# Patient Record
Sex: Female | Born: 1990
Health system: Southern US, Community
[De-identification: ages and names within clinical notes are randomized; demographics above are authoritative.]

## PROBLEM LIST (undated history)

## (undated) DIAGNOSIS — F419 Anxiety disorder, unspecified: Secondary | ICD-10-CM

## (undated) DIAGNOSIS — T4145XA Adverse effect of unspecified anesthetic, initial encounter: Secondary | ICD-10-CM

## (undated) DIAGNOSIS — T8859XA Other complications of anesthesia, initial encounter: Secondary | ICD-10-CM

## (undated) DIAGNOSIS — R51 Headache: Secondary | ICD-10-CM

## (undated) DIAGNOSIS — R519 Headache, unspecified: Secondary | ICD-10-CM

## (undated) DIAGNOSIS — J45909 Unspecified asthma, uncomplicated: Secondary | ICD-10-CM

## (undated) HISTORY — PX: LAPAROSCOPIC GASTRIC SLEEVE RESECTION: SHX5895

## (undated) HISTORY — PX: TONSILLECTOMY: SUR1361

---

## 2015-05-12 DIAGNOSIS — K219 Gastro-esophageal reflux disease without esophagitis: Secondary | ICD-10-CM | POA: Insufficient documentation

## 2015-09-15 DIAGNOSIS — R7309 Other abnormal glucose: Secondary | ICD-10-CM | POA: Insufficient documentation

## 2015-11-09 DIAGNOSIS — G43919 Migraine, unspecified, intractable, without status migrainosus: Secondary | ICD-10-CM | POA: Diagnosis not present

## 2015-11-09 MED FILL — SUMATRIPTAN SUCC 50 MG TAB: 50 | 30 days supply | Qty: 9 | Fill #0

## 2015-12-15 MED FILL — TOPIRAMATE 25 MG TABLET: 25 | 30 days supply | Qty: 30 | Fill #0

## 2016-01-11 MED FILL — VALACYCLOVIR HCL 500 MG TAB: 500 | 15 days supply | Qty: 30 | Fill #0

## 2016-01-21 DIAGNOSIS — T300 Burn of unspecified body region, unspecified degree: Secondary | ICD-10-CM | POA: Diagnosis not present

## 2016-01-21 MED FILL — SILVADENE 1% CREAM: 1 | 30 days supply | Qty: 50 | Fill #0

## 2016-02-08 DIAGNOSIS — J209 Acute bronchitis, unspecified: Secondary | ICD-10-CM | POA: Diagnosis not present

## 2016-02-08 DIAGNOSIS — Z30431 Encounter for routine checking of intrauterine contraceptive device: Secondary | ICD-10-CM | POA: Diagnosis not present

## 2016-02-08 DIAGNOSIS — H109 Unspecified conjunctivitis: Secondary | ICD-10-CM | POA: Diagnosis not present

## 2016-02-08 DIAGNOSIS — Z975 Presence of (intrauterine) contraceptive device: Secondary | ICD-10-CM | POA: Diagnosis not present

## 2016-02-08 MED FILL — AZITHROMYCIN 250 MG TABLET: 250 | 5 days supply | Qty: 6 | Fill #0

## 2016-02-08 MED FILL — POLYMYXIN B/TMP EYE DROPS: 10000-0.1 | 25 days supply | Qty: 10 | Fill #0

## 2016-03-29 DIAGNOSIS — B001 Herpesviral vesicular dermatitis: Secondary | ICD-10-CM | POA: Diagnosis not present

## 2016-03-29 DIAGNOSIS — J209 Acute bronchitis, unspecified: Secondary | ICD-10-CM | POA: Diagnosis not present

## 2016-03-29 MED FILL — ALBUTEROL 0.083% INHAL SOLN: (2.5 MG/3ML | 13 days supply | Qty: 150 | Fill #0

## 2016-03-29 MED FILL — VENTOLIN HFA 90 MCG INHALER: 108 (90 BAS | 30 days supply | Qty: 18 | Fill #0

## 2016-03-29 MED FILL — VALACYCLOVIR HCL 500 MG TAB: 500 | 30 days supply | Qty: 30 | Fill #0

## 2016-03-29 MED FILL — predniSONE 10 MG TABS: 10 | 6 days supply | Qty: 21 | Fill #0

## 2016-03-31 MED FILL — PROMETHAZINE-DM SYRUP: 6.25-15 | 6 days supply | Qty: 118 | Fill #0

## 2016-03-31 MED FILL — AZITHROMYCIN 250 MG TABLET: 250 | 5 days supply | Qty: 6 | Fill #0

## 2016-04-01 ENCOUNTER — Encounter (HOSPITAL_BASED_OUTPATIENT_CLINIC_OR_DEPARTMENT_OTHER): Payer: Self-pay | Admitting: Emergency Medicine

## 2016-04-01 ENCOUNTER — Emergency Department (HOSPITAL_BASED_OUTPATIENT_CLINIC_OR_DEPARTMENT_OTHER)
Admission: EM | Admit: 2016-04-01 | Discharge: 2016-04-01 | Disposition: A | Discharge status: Home / Self Care | Payer: 59 | Attending: Emergency Medicine | Admitting: Emergency Medicine

## 2016-04-01 ENCOUNTER — Emergency Department (HOSPITAL_BASED_OUTPATIENT_CLINIC_OR_DEPARTMENT_OTHER): Payer: 59

## 2016-04-01 DIAGNOSIS — R05 Cough: Secondary | ICD-10-CM | POA: Diagnosis not present

## 2016-04-01 DIAGNOSIS — J45909 Unspecified asthma, uncomplicated: Secondary | ICD-10-CM | POA: Insufficient documentation

## 2016-04-01 DIAGNOSIS — J069 Acute upper respiratory infection, unspecified: Secondary | ICD-10-CM | POA: Diagnosis not present

## 2016-04-01 HISTORY — DX: Unspecified asthma, uncomplicated: J45.909

## 2016-04-01 NOTE — Discharge Instructions (Signed)
Continue with your current treatment plan. Follow-up with your primary care provider.

## 2016-04-01 NOTE — ED Notes (Signed)
ED Provider at bedside. 

## 2016-04-01 NOTE — ED Triage Notes (Signed)
Pt states that since Wednesday she has had cough and chest congestion and isn't getting better

## 2016-04-01 NOTE — ED Notes (Signed)
Pt and mother given d/c instructions as per chart. Verbalizes understanding. No questions. 

## 2016-04-01 NOTE — ED Notes (Signed)
Pt states she was diagnosed with bronchitis on Wed and her asthma is flaring up. Given Rx for antibiotic yesterday and had 2nd dose today. Also states her treatments are not helping. Presents in no distress. BBS-few scattered rhonchi noted.

## 2016-04-01 NOTE — ED Provider Notes (Signed)
[facility] Provider Note   CSN: [id number] Arrival date & time: 04/01/16  2115  By signing my name below, I, Tracy Brooks, attest that this documentation has been prepared under the direction and in the presence of [name], NP-C. Electronically Signed: Orpah Brooks , ED Scribe. 04/01/16. 11:04 PM.   History   Chief Complaint Chief Complaint  Patient presents with  . Cough    HPI  Tracy Brooks is a 26 y.o. female who presents to the Emergency Department complaining of mild to moderate cough with sudden onset x3 days ago. Pt states that she was seen by her PCP x3 days ago and diagnosed with bronchitis. Pt states that she was prescribed Promethazine, Prednisone and has taken nebulizer treatments with mild relief. She reports cough, chest tightness, chills. Pt denies fever.     The history is provided by the patient. No language interpreter was used.    Past Medical History:  Diagnosis Date  . Asthma     There are no active problems to display for this patient.   Past Surgical History:  Procedure Laterality Date  . TONSILLECTOMY      OB History    No data available       Home Medications    Prior to Admission medications   Medication Sig Start Date End Date Taking? Authorizing Provider  ALBUTEROL SULFATE HFA IN Inhale into the lungs.   Yes Historical Provider, MD    Family History History reviewed. No pertinent family history.  Social History Social History  Substance Use Topics  . Smoking status: Never Smoker  . Smokeless tobacco: Never Used  . Alcohol use Yes     Allergies   Patient has no known allergies.   Review of Systems Review of Systems  Constitutional: Positive for chills. Negative for fever.  Respiratory: Positive for cough and chest tightness.   All other systems reviewed and are negative.    Physical Exam Updated Vital Signs BP 157/96   Pulse 103   Temp 98.6 F (37 C) (Oral)   Resp 18   Ht 5\' 3"  (1.6 m)    Wt 286 lb (129.7 kg)   SpO2 100%   BMI 50.66 kg/m   Physical Exam  Constitutional: She appears well-developed and well-nourished. No distress.  HENT:  Head: Normocephalic and atraumatic.  Eyes: Conjunctivae are normal.  Neck: Neck supple.  Cardiovascular: Normal rate and regular rhythm.   No murmur heard. Pulmonary/Chest: Effort normal and breath sounds normal. No respiratory distress.  Chest is clear.  Abdominal: Soft. There is no tenderness.  Musculoskeletal: She exhibits no edema.  Neurological: She is alert.  Skin: Skin is warm and dry.  Psychiatric: She has a normal mood and affect.  Nursing note and vitals reviewed.    ED Treatments / Results   DIAGNOSTIC STUDIES: Oxygen Saturation is 100% on RA, normal by my interpretation.   COORDINATION OF CARE: 11:05 PM-Discussed next steps with pt. Pt verbalized understanding and is agreeable with the plan.    Labs (all labs ordered are listed, but only abnormal results are displayed) Labs Reviewed - No data to display  EKG  EKG Interpretation None       Radiology Dg Chest 2 View  Result Date: 04/01/2016 CLINICAL DATA:  Cough and chest congestion since Wednesday, not improving. EXAM: CHEST  2 VIEW COMPARISON:  None. FINDINGS: The heart size and mediastinal contours are within normal limits. Both lungs are clear. The visualized skeletal structures are unremarkable. IMPRESSION:  No active cardiopulmonary disease. Electronically Signed   By: Burman NievesWilliam  Stevens M.D.   On: 04/01/2016 21:38    Procedures Procedures (including critical care time)  Medications Ordered in ED Medications - No data to display   Initial Impression / Assessment and Plan / ED Course  I have reviewed the triage vital signs and the nursing notes.  Pertinent labs & imaging results that were available during my care of the patient were reviewed by me and considered in my medical decision making (see chart for details).  Clinical Course   Pt  symptoms consistent with URI.CXR negative for acute infiltrate. Pt will be discharged with symptomatic treatment. Patient has already been evaluated by her PCP and is currently on azithromycin, prednisone, phenergan DM, albuterol MDI. Discussed return precautions.  Pt is hemodynamically stable & in NAD prior to discharge.    Final Clinical Impressions(s) / ED Diagnoses   Final diagnoses:  Upper respiratory tract infection, unspecified type    New Prescriptions Discharge Medication List as of 04/01/2016 11:36 PM    I personally performed the services described in this documentation, which was scribed in my presence. The recorded information has been reviewed and is accurate.    Felicie Mornavid Yoshio Seliga, NP 04/02/16 0022    Blane OharaJoshua Zavitz, MD 04/02/16 [phone]

## 2016-04-24 MED FILL — OSELTAMIVIR PHOS 75 MG CAP: 75 | 10 days supply | Qty: 10 | Fill #0

## 2016-05-10 DIAGNOSIS — Z30431 Encounter for routine checking of intrauterine contraceptive device: Secondary | ICD-10-CM | POA: Diagnosis not present

## 2016-05-11 DIAGNOSIS — E6609 Other obesity due to excess calories: Secondary | ICD-10-CM | POA: Diagnosis not present

## 2016-05-11 DIAGNOSIS — Z6841 Body Mass Index (BMI) 40.0 and over, adult: Secondary | ICD-10-CM | POA: Diagnosis not present

## 2016-05-11 DIAGNOSIS — F5104 Psychophysiologic insomnia: Secondary | ICD-10-CM | POA: Diagnosis not present

## 2016-06-08 ENCOUNTER — Encounter (INDEPENDENT_AMBULATORY_CARE_PROVIDER_SITE_OTHER): Payer: Self-pay | Admitting: Family Medicine

## 2016-07-27 ENCOUNTER — Other Ambulatory Visit (HOSPITAL_COMMUNITY): Payer: Self-pay | Admitting: Surgery

## 2016-08-15 ENCOUNTER — Encounter: Payer: 59 | Attending: Surgery | Admitting: Registered"

## 2016-08-15 ENCOUNTER — Ambulatory Visit: Payer: 59 | Admitting: Registered"

## 2016-08-15 ENCOUNTER — Encounter: Payer: Self-pay | Admitting: Registered"

## 2016-08-15 DIAGNOSIS — Z713 Dietary counseling and surveillance: Secondary | ICD-10-CM | POA: Diagnosis not present

## 2016-08-15 DIAGNOSIS — Z6841 Body Mass Index (BMI) 40.0 and over, adult: Secondary | ICD-10-CM | POA: Diagnosis not present

## 2016-08-15 DIAGNOSIS — E669 Obesity, unspecified: Secondary | ICD-10-CM

## 2016-08-15 NOTE — Progress Notes (Signed)
Pre-Op Assessment Visit:  Pre-Operative Sleeve Gastrectomy Surgery  Medical Nutrition Therapy:  Appt start time: 9:45  End time:  11:00  Patient was seen on 08/15/2016 for Pre-Operative Nutrition Assessment. Assessment and [facility] on 08/15/2016.   Pt expectation of surgery: lose weight and be healthier  Pt expectation of Dietitian: help understanding what needs to be done, accountability, teach how to eat correctly  Start weight at [facility]: 293.3 BMI: 52.37   Pt arrives with mom. Pt states she is [place] and eats a lot of starches. Pt states she doesn't eat a lot of sweets but has her days where she craves them. Pt states she enjoys chips. Pt states her ideal goal size would be size 10.   Per insurance, pt needs 0 SWL visits prior to surgery.    24 hr Dietary Recall: First Meal: eggs or pancakes or McDonalds biscuit Snack: chips with mojito dip (garlic, oil, vinegar, salt, pepper, and spanish seasoning) Second Meal: skips normally; frozen meals, steamers Snack: none Third Meal: meat or pasta and 2-3 vegetables  Snack: sugar cookies Beverages: water, soda (occasionally)  Encouraged to engage in 150 minutes of moderate physical activity including cardiovascular and weight baring weekly  Handouts given during visit include:  . Pre-Op Goals . Bariatric Surgery Protein Shakes . Vitamin and Mineral Recommendations  During the appointment today the following Pre-Op Goals were reviewed with the patient: . Maintain or lose weight as instructed by your surgeon . Make healthy food choices (no fast food) . Begin to limit portion sizes . Limited concentrated sugars and fried foods . Keep fat/sugar in the single digits per serving on food labels . Practice CHEWING your food  (aim for 30 chews per bite or until applesauce consistency) . Practice not drinking 15 minutes before, during, and 30 minutes after  each meal/snack . Avoid all carbonated beverages  . Avoid/limit caffeinated beverages  . Avoid all sugar-sweetened beverages . Consume 3 meals per day; eat every 3-5 hours . Make a list of non-food related activities . Aim for 64-100 ounces of FLUID daily  . Aim for at least 60-80 grams of PROTEIN daily . Look for a liquid protein source that contain ?15 g protein and ?5 g carbohydrate  (ex: shakes, drinks, shots) . Physical activity is an important part of a healthy lifestyle so keep it moving!  Follow diet recommendations listed below Energy and Macronutrient Recommendations: Calories: 1800 Carbohydrate: 200 Protein: 135 Fat: 50  Demonstrated degree of understanding via:  Teach Back   Teaching Method Utilized:  Visual Auditory Hands on  Barriers to learning/adherence to lifestyle change: none  Patient to call the [facility] to enroll in Pre-Op and Post-Op Nutrition Education when surgery date is scheduled.

## 2016-08-22 ENCOUNTER — Ambulatory Visit: Payer: 59 | Admitting: Registered"

## 2016-08-22 ENCOUNTER — Ambulatory Visit (HOSPITAL_COMMUNITY): Payer: 59

## 2016-08-29 ENCOUNTER — Ambulatory Visit (HOSPITAL_COMMUNITY)
Admission: RE | Admit: 2016-08-29 | Discharge: 2016-08-29 | Disposition: A | Discharge status: Home / Self Care | Payer: 59 | Source: Ambulatory Visit | Attending: Surgery | Admitting: Surgery

## 2016-08-29 ENCOUNTER — Encounter (HOSPITAL_COMMUNITY): Payer: Self-pay | Admitting: Radiology

## 2016-08-29 ENCOUNTER — Other Ambulatory Visit: Payer: Self-pay

## 2016-08-29 ENCOUNTER — Other Ambulatory Visit (HOSPITAL_COMMUNITY): Payer: Self-pay | Admitting: Surgery

## 2016-08-29 DIAGNOSIS — Z01810 Encounter for preprocedural cardiovascular examination: Secondary | ICD-10-CM | POA: Insufficient documentation

## 2016-08-29 DIAGNOSIS — Z01818 Encounter for other preprocedural examination: Secondary | ICD-10-CM | POA: Diagnosis not present

## 2016-09-21 ENCOUNTER — Ambulatory Visit (INDEPENDENT_AMBULATORY_CARE_PROVIDER_SITE_OTHER): Payer: 59 | Admitting: Psychiatry

## 2016-09-21 DIAGNOSIS — F509 Eating disorder, unspecified: Secondary | ICD-10-CM | POA: Diagnosis not present

## 2016-09-26 ENCOUNTER — Ambulatory Visit (INDEPENDENT_AMBULATORY_CARE_PROVIDER_SITE_OTHER): Payer: 59 | Admitting: Psychiatry

## 2016-09-26 DIAGNOSIS — F509 Eating disorder, unspecified: Secondary | ICD-10-CM

## 2016-10-05 DIAGNOSIS — M25512 Pain in left shoulder: Secondary | ICD-10-CM | POA: Diagnosis not present

## 2016-10-05 MED FILL — tiZANidine HCL 4 MG TABS: 4 | 7 days supply | Qty: 30 | Fill #0

## 2016-11-13 ENCOUNTER — Encounter: Payer: 59 | Attending: Surgery | Admitting: Skilled Nursing Facility1

## 2016-11-13 DIAGNOSIS — E669 Obesity, unspecified: Secondary | ICD-10-CM | POA: Insufficient documentation

## 2016-11-15 ENCOUNTER — Encounter: Payer: Self-pay | Admitting: Skilled Nursing Facility1

## 2016-11-15 NOTE — Progress Notes (Signed)
Pre-Operative Nutrition Class:  Appt start time: 8841   End time:  1830.  Patient was seen on 11/13/2016 for Pre-Operative Bariatric Surgery Education at the [facility].   Surgery date: 12/04/2016 Surgery type: Sleeve Start weight at [facility]: 293.3 Weight today: 291.6  Samples given per MNT protocol. Patient educated on appropriate usage: Bariatric Advantage Multivitamin Lot # [id number] Exp: 06/19  Bariatric Advantage Calcium  Lot # [id number] Exp: dec-19-2018  PremierProtein Shake Lot # [id number] Exp: 27/nov/2018  The following the learning objectives were met by the patient during this course:  Identify Pre-Op Dietary Goals and will begin 2 weeks pre-operatively  Identify appropriate sources of fluids and proteins   State protein recommendations and appropriate sources pre and post-operatively  Identify Post-Operative Dietary Goals and will follow for 2 weeks post-operatively  Identify appropriate multivitamin and calcium sources  Describe the need for physical activity post-operatively and will follow MD recommendations  State when to call healthcare provider regarding medication questions or post-operative complications  Handouts given during class include:  Pre-Op Bariatric Surgery Diet Handout  Protein Shake Handout  Post-Op Bariatric Surgery Nutrition Handout  BELT Program Information Flyer  Support Group Information Flyer  [facility] Bariatric Supplements Price List  Follow-Up Plan: Patient will follow-up at [facility] 2 weeks post operatively for diet advancement per MD.

## 2016-11-16 ENCOUNTER — Telehealth: Payer: Self-pay | Admitting: Skilled Nursing Facility1

## 2016-11-16 NOTE — Telephone Encounter (Signed)
Yes. Sugar Free jello  From: Verne SpurrJuliza [email or web address]>  Sent: Thursday, November 16, 2016 11:59 AM To: Serena ColonelScotece, [email or web address]> Subject: Re: [External Email]Questions.  *Caution - External Email* I was think of mixing it in the jello so it would be poured in to little condiments cups. Separated would that be okay  Sent from my iPhone

## 2016-11-24 ENCOUNTER — Ambulatory Visit: Payer: Self-pay | Admitting: Surgery

## 2016-11-24 MED FILL — ONDANSETRON ODT 4 MG TABLET: 4 | 5 days supply | Qty: 20 | Fill #0

## 2016-11-24 MED FILL — PANTOPRAZOLE SOD DR 40 MG T: 40 | 30 days supply | Qty: 30 | Fill #0

## 2016-11-24 NOTE — H&P (Signed)
Victorine J Duby Location: [facility] Patient #: [id number] DOB: 07/21/1990 Single / Language: English / Race: White Female  History of Present Illness The patient is a 25 year old female who presents for a bariatric surgery evaluation. The patient has attended a seminar and is interested in a sleeve gastrectomy. She is 25 years old and is still with lifelong obesity. When she reached 290 pounds she had a tipping point where she decided she wanted investigate surgery. She has done a lot including very heavy physical work outs and the GM along with dieting and these are short-lived. She came with her mom. Today's weight is 288 with a BMI of 52.6. She is 5 feet 2 inches. She denies obstructive sleep apnea. She does have occasional GERD. She has never had DVT. She's never had abdominal surgery.  I reviewed sleeve gastrectomy with her in detail including complications notl limited to bleeding and leaks. She has a good knowledge of the procedure its risks and indications. I think she would be a good candidate for sleeve gastrectomy and we'll begin the workup for this.  Past Surgical History Tonsillectomy   Diagnostic Studies History  Colonoscopy  never Pap Smear  1-5 years ago  Allergies  Adhesive Tape  Allergies Reconciled   Medication History Albuterol Sulfate ((2.5 MG/3ML)0.083% Nebulized Soln, Inhalation) Active. Skyla (13.5MG IUD, Intrauterine) Active. Medications Reconciled  Social History Alcohol use  Occasional alcohol use. Caffeine use  Carbonated beverages, Coffee, Tea. Tobacco use  Former smoker.  Family History  Breast Cancer  Family Members In General. Cancer  Family Members In General. Depression  Father, Mother. Diabetes Mellitus  Family Members In General, Mother. Hypertension  Family Members In General. Kidney Disease  Family Members In General. Malignant Neoplasm Of Pancreas  Family Members In General. Migraine Headache   Family Members In General. Prostate Cancer  Family Members In General. Respiratory Condition  Family Members In General. Thyroid problems  Family Members In General, Father.  Pregnancy / Birth History Age at menarche  14 years. Contraceptive History  Intrauterine device. Gravida  0 Irregular periods  Para  0  Other Problems Asthma  Back Pain  Depression  Gastroesophageal Reflux Disease  Migraine Headache     Review of Systems General Present- Fatigue. Not Present- Appetite Loss, Chills, Fever, Night Sweats, Weight Gain and Weight Loss. Skin Not Present- Change in Wart/Mole, Dryness, Hives, Jaundice, New Lesions, Non-Healing Wounds, Rash and Ulcer. HEENT Present- Seasonal Allergies. Not Present- Earache, Hearing Loss, Hoarseness, Nose Bleed, Oral Ulcers, Ringing in the Ears, Sinus Pain, Sore Throat, Visual Disturbances, Wears glasses/contact lenses and Yellow Eyes. Respiratory Not Present- Bloody sputum, Chronic Cough, Difficulty Breathing, Snoring and Wheezing. Breast Not Present- Breast Mass, Breast Pain, Nipple Discharge and Skin Changes. Cardiovascular Not Present- Chest Pain, Difficulty Breathing Lying Down, Leg Cramps, Palpitations, Rapid Heart Rate, Shortness of Breath and Swelling of Extremities. Gastrointestinal Not Present- Abdominal Pain, Bloating, Bloody Stool, Change in Bowel Habits, Chronic diarrhea, Constipation, Difficulty Swallowing, Excessive gas, Gets full quickly at meals, Hemorrhoids, Indigestion, Nausea, Rectal Pain and Vomiting. Female Genitourinary Not Present- Frequency, Nocturia, Painful Urination, Pelvic Pain and Urgency. Neurological Present- Headaches. Not Present- Decreased Memory, Fainting, Numbness, Seizures, Tingling, Tremor, Trouble walking and Weakness. Psychiatric Present- Anxiety. Not Present- Bipolar, Change in Sleep Pattern, Depression, Fearful and Frequent crying. Endocrine Not Present- Cold Intolerance, Excessive Hunger, Hair  Changes, Heat Intolerance, Hot flashes and New Diabetes. Hematology Not Present- Blood Thinners, Easy Bruising, Excessive bleeding, Gland problems, HIV and Persistent Infections.    Vitals  Weight: 288 lb Height: 62in Body Surface Area: 2.23 m Body Mass Index: 52.68 kg/m  Temp.: 98.27F  Pulse: 104 (Regular)  BP: 122/90 (Sitting, Left Arm, Standard)   Physical Exam  General Note: WDobese WF NAD HEENT unremarkable Neck supple Chest clear Abdomen nontender and obese Ext FROM without edema; no hx of DVT   Assessment & Plan Molli Hazard(Drevon Plog B. Daphine DeutscherMartin MD; 07/26/2016 5:02 PM) MORBID OBESITY, UNSPECIFIED OBESITY TYPE (E66.01)  Plan:  Lap sleeve gastrectomy;  UGI showed no hiatal hernia  Matt B. Daphine DeutscherMartin, MD. Carilyn GoodpastureFACS

## 2016-11-29 NOTE — Patient Instructions (Signed)
Tracy Brooks  11/29/2016   Your procedure is scheduled on: 12/04/16  Report to [facility] Main  Entrance Take [place]  elevators to 3rd floor to  Short Stay Center at     [phone].    Call this number if you have problems the morning of surgery [phone]    Remember: ONLY 1 PERSON MAY GO WITH YOU TO SHORT STAY TO GET  READY MORNING OF YOUR SURGERY.  Do not eat food or drink liquids :After Midnight.     Take these medicines the morning of surgery with A SIP OF WATER: none                                You may not have any metal on your body including hair pins and              piercings  Do not wear jewelry, make-up, lotions, powders or perfumes, deodorant             Do not wear nail polish.  Do not shave  48 hours prior to surgery.           .   Do not bring valuables to the hospital. [facility] IS NOT             RESPONSIBLE   FOR VALUABLES.  Contacts, dentures or bridgework may not be worn into surgery.  Leave suitcase in the car. After surgery it may be brought to your room.                 Please read over the following fact sheets you were given: _____________________________________________________________________           [facility] - Preparing for Surgery Before surgery, you can play an important role.  Because skin is not sterile, your skin needs to be as free of germs as possible.  You can reduce the number of germs on your skin by washing with CHG (chlorahexidine gluconate) soap before surgery.  CHG is an antiseptic cleaner which kills germs and bonds with the skin to continue killing germs even after washing. Please DO NOT use if you have an allergy to CHG or antibacterial soaps.  If your skin becomes reddened/irritated stop using the CHG and inform your nurse when you arrive at Short Stay. Do not shave (including legs and underarms) for at least 48 hours prior to the first CHG shower.  You may shave your face/neck. Please follow these  instructions carefully:  1.  Shower with CHG Soap the night before surgery and the  morning of Surgery.  2.  If you choose to wash your hair, wash your hair first as usual with your  normal  shampoo.  3.  After you shampoo, rinse your hair and body thoroughly to remove the  shampoo.                           4.  Use CHG as you would any other liquid soap.  You can apply chg directly  to the skin and wash                       Gently with a scrungie or clean washcloth.  5.  Apply the CHG Soap to your body ONLY FROM THE  NECK DOWN.   Do not use on face/ open                           Wound or open sores. Avoid contact with eyes, ears mouth and genitals (private parts).                       Wash face,  Genitals (private parts) with your normal soap.             6.  Wash thoroughly, paying special attention to the area where your surgery  will be performed.  7.  Thoroughly rinse your body with warm water from the neck down.  8.  DO NOT shower/wash with your normal soap after using and rinsing off  the CHG Soap.                9.  Pat yourself dry with a clean towel.            10.  Wear clean pajamas.            11.  Place clean sheets on your bed the night of your first shower and do not  sleep with pets. Day of Surgery : Do not apply any lotions/deodorants the morning of surgery.  Please wear clean clothes to the hospital/surgery center.  FAILURE TO FOLLOW THESE INSTRUCTIONS MAY RESULT IN THE CANCELLATION OF YOUR SURGERY PATIENT SIGNATURE_________________________________  NURSE SIGNATURE__________________________________  ________________________________________________________________________   Adam Phenix  An incentive spirometer is a tool that can help keep your lungs clear and active. This tool measures how well you are filling your lungs with each breath. Taking long deep breaths may help reverse or decrease the chance of developing breathing (pulmonary) problems (especially  infection) following:  A long period of time when you are unable to move or be active. BEFORE THE PROCEDURE   If the spirometer includes an indicator to show your best effort, your nurse or respiratory therapist will set it to a desired goal.  If possible, sit up straight or lean slightly forward. Try not to slouch.  Hold the incentive spirometer in an upright position. INSTRUCTIONS FOR USE  1. Sit on the edge of your bed if possible, or sit up as far as you can in bed or on a chair. 2. Hold the incentive spirometer in an upright position. 3. Breathe out normally. 4. Place the mouthpiece in your mouth and seal your lips tightly around it. 5. Breathe in slowly and as deeply as possible, raising the piston or the ball toward the top of the column. 6. Hold your breath for 3-5 seconds or for as long as possible. Allow the piston or ball to fall to the bottom of the column. 7. Remove the mouthpiece from your mouth and breathe out normally. 8. Rest for a few seconds and repeat Steps 1 through 7 at least 10 times every 1-2 hours when you are awake. Take your time and take a few normal breaths between deep breaths. 9. The spirometer may include an indicator to show your best effort. Use the indicator as a goal to work toward during each repetition. 10. After each set of 10 deep breaths, practice coughing to be sure your lungs are clear. If you have an incision (the cut made at the time of surgery), support your incision when coughing by placing a pillow or rolled up towels firmly against it. Once you are able  to get out of bed, walk around indoors and cough well. You may stop using the incentive spirometer when instructed by your caregiver.  RISKS AND COMPLICATIONS  Take your time so you do not get dizzy or light-headed.  If you are in pain, you may need to take or ask for pain medication before doing incentive spirometry. It is harder to take a deep breath if you are having pain. AFTER  USE  Rest and breathe slowly and easily.  It can be helpful to keep track of a log of your progress. Your caregiver can provide you with a simple table to help with this. If you are using the spirometer at home, follow these instructions: SEEK MEDICAL CARE IF:   You are having difficultly using the spirometer.  You have trouble using the spirometer as often as instructed.  Your pain medication is not giving enough relief while using the spirometer.  You develop fever of 100.5 F (38.1 C) or higher. SEEK IMMEDIATE MEDICAL CARE IF:   You cough up bloody sputum that had not been present before.  You develop fever of 102 F (38.9 C) or greater.  You develop worsening pain at or near the incision site. MAKE SURE YOU:   Understand these instructions.  Will watch your condition.  Will get help right away if you are not doing well or get worse. Document Released: 07/17/2006 Document Revised: 05/29/2011 Document Reviewed: 09/17/2006 [facility] Patient Information 2014 [employer] [place].  REMEMBER TO TURN,COUGH AND DEEP BREATHE AFTER YOUR SURGERY ________________________________________________________________________

## 2016-11-30 ENCOUNTER — Encounter (HOSPITAL_COMMUNITY)
Admission: RE | Admit: 2016-11-30 | Discharge: 2016-11-30 | Disposition: A | Discharge status: Home / Self Care | Payer: 59 | Source: Ambulatory Visit | Attending: Surgery | Admitting: Surgery

## 2016-11-30 ENCOUNTER — Other Ambulatory Visit: Payer: Self-pay | Admitting: *Deleted

## 2016-11-30 ENCOUNTER — Encounter (HOSPITAL_COMMUNITY): Payer: Self-pay

## 2016-11-30 DIAGNOSIS — Z01812 Encounter for preprocedural laboratory examination: Secondary | ICD-10-CM | POA: Insufficient documentation

## 2016-11-30 HISTORY — DX: Other complications of anesthesia, initial encounter: T88.59XA

## 2016-11-30 HISTORY — DX: Headache, unspecified: R51.9

## 2016-11-30 HISTORY — DX: Anxiety disorder, unspecified: F41.9

## 2016-11-30 HISTORY — DX: Adverse effect of unspecified anesthetic, initial encounter: T41.45XA

## 2016-11-30 HISTORY — DX: Headache: R51

## 2016-11-30 LAB — COMPREHENSIVE METABOLIC PANEL
ALBUMIN: 4.2 g/dL (ref 3.5–5.0)
ALT: 36 U/L (ref 14–54)
AST: 28 U/L (ref 15–41)
Alkaline Phosphatase: 47 U/L (ref 38–126)
Anion gap: 7 (ref 5–15)
BUN: 17 mg/dL (ref 6–20)
CHLORIDE: 105 mmol/L (ref 101–111)
CO2: 26 mmol/L (ref 22–32)
CREATININE: 0.69 mg/dL (ref 0.44–1.00)
Calcium: 9.2 mg/dL (ref 8.9–10.3)
GFR calc non Af Amer: >60 mL/min (ref >60)
GLUCOSE: 96 mg/dL (ref 65–99)
Potassium: 4.6 mmol/L (ref 3.5–5.1)
SODIUM: 138 mmol/L (ref 135–145)
Total Bilirubin: 1 mg/dL (ref 0.3–1.2)
Total Protein: 7.9 g/dL (ref 6.5–8.1)

## 2016-11-30 LAB — CBC WITH DIFFERENTIAL/PLATELET
BASOS ABS: 0 10*3/uL (ref 0.0–0.1)
BASOS PCT: 0 %
EOS ABS: 0.1 10*3/uL (ref 0.0–0.7)
EOS PCT: 2 %
HCT: 41.3 % (ref 36.0–46.0)
Hemoglobin: 13.9 g/dL (ref 12.0–15.0)
Lymphocytes Relative: 26 %
Lymphs Abs: 2.2 10*3/uL (ref 0.7–4.0)
MCH: 28.1 pg (ref 26.0–34.0)
MCHC: 33.7 g/dL (ref 30.0–36.0)
MCV: 83.6 fL (ref 78.0–100.0)
MONOS PCT: 5 %
Monocytes Absolute: 0.5 10*3/uL (ref 0.1–1.0)
Neutro Abs: 5.6 10*3/uL (ref 1.7–7.7)
Neutrophils Relative %: 67 %
PLATELETS: 223 10*3/uL (ref 150–400)
RBC: 4.94 MIL/uL (ref 3.87–5.11)
RDW: 12.9 % (ref 11.5–15.5)
WBC: 8.4 10*3/uL (ref 4.0–10.5)

## 2016-11-30 MED FILL — oxyCODONE HCL 5 MG/5ML SOLN: 5 | 2 days supply | Qty: 120 | Fill #0

## 2016-11-30 NOTE — Progress Notes (Signed)
ekg 08/29/16 epic  cxr 08/29/16 epic

## 2016-11-30 NOTE — Patient Outreach (Signed)
[facility] Care Management  11/30/2016  Tracy SpurrJuliza Brooks [birth year] [id number]   Subjective: Telephone call to patient's home  number, spoke with patient, and HIPAA verified.  [facility] Transition of care follow up, preoperative call follow up, patient voiced understanding, and is in agreement to both types of follow up.  Patient states she is doing well and is ready for [facility] on [facility]04[facility]. States she is accessing the following [employer] benefits and mother is [employer] employee: outpatient pharmacy, hospital indemnity (will discuss with mother to advise to file claim if appropriate), and mother has family medical leave act (FMLA) in place.  States she recently quit her job because they would not allow her to take off needed amount of time to recover from surgery.  Patient states she does not have any preoperative questions, care coordination, disease management, disease monitoring, transportation, community resource, or pharmacy needs at this time.    Objective: Per chart review, patient to be [facility] for LAPAROSCOPIC GASTRIC SLEEVE RESECTION WITH UPPER ENDO.       Assessment: Received [facility] Preoperative Call and Transition of care referral on 11/22/16.  Preoperative call completed, and transition of care follow up pending notification of patient discharge.    Plan: RNCM will call patient for  telephone outreach attempt, transition of care follow up, within 3 business days of hospital discharge notification.   Shaughnessy Gethers H. Gardiner Barefootooper RN, BSN, CCM [facility] Telephonic CM Phone: [phone] Fax: [phone]

## 2016-12-04 ENCOUNTER — Inpatient Hospital Stay (HOSPITAL_COMMUNITY): Payer: 59

## 2016-12-04 ENCOUNTER — Inpatient Hospital Stay (HOSPITAL_COMMUNITY)
Admission: RE | Admit: 2016-12-04 | Discharge: 2016-12-06 | DRG: 621 | Disposition: A | Discharge status: Home / Self Care | Payer: 59 | Source: Ambulatory Visit | Attending: Surgery | Admitting: Surgery

## 2016-12-04 ENCOUNTER — Encounter (HOSPITAL_COMMUNITY): Payer: Self-pay | Admitting: *Deleted

## 2016-12-04 ENCOUNTER — Encounter (HOSPITAL_COMMUNITY)
Admission: RE | Disposition: A | Discharge status: Home / Self Care | Payer: Self-pay | Source: Ambulatory Visit | Attending: Surgery

## 2016-12-04 DIAGNOSIS — R51 Headache: Secondary | ICD-10-CM | POA: Diagnosis present

## 2016-12-04 DIAGNOSIS — J45909 Unspecified asthma, uncomplicated: Secondary | ICD-10-CM | POA: Diagnosis present

## 2016-12-04 DIAGNOSIS — Z6841 Body Mass Index (BMI) 40.0 and over, adult: Secondary | ICD-10-CM

## 2016-12-04 DIAGNOSIS — K219 Gastro-esophageal reflux disease without esophagitis: Secondary | ICD-10-CM | POA: Diagnosis not present

## 2016-12-04 DIAGNOSIS — Z9884 Bariatric surgery status: Secondary | ICD-10-CM

## 2016-12-04 HISTORY — PX: LAPAROSCOPIC GASTRIC SLEEVE RESECTION: SHX5895

## 2016-12-04 LAB — CBC
HEMATOCRIT: 41 % (ref 36.0–46.0)
Hemoglobin: 13.5 g/dL (ref 12.0–15.0)
MCH: 27.9 pg (ref 26.0–34.0)
MCHC: 32.9 g/dL (ref 30.0–36.0)
MCV: 84.7 fL (ref 78.0–100.0)
PLATELETS: 194 10*3/uL (ref 150–400)
RBC: 4.84 MIL/uL (ref 3.87–5.11)
RDW: 13.1 % (ref 11.5–15.5)
WBC: 12.8 10*3/uL — ABNORMAL HIGH (ref 4.0–10.5)

## 2016-12-04 LAB — PREGNANCY, URINE: PREG TEST UR: NEGATIVE

## 2016-12-04 LAB — CREATININE, SERUM
Creatinine, Ser: 0.77 mg/dL (ref 0.44–1.00)
GFR calc Af Amer: >60 mL/min (ref >60)
GFR calc non Af Amer: >60 mL/min (ref >60)

## 2016-12-04 SURGERY — GASTRECTOMY, SLEEVE, LAPAROSCOPIC
Anesthesia: General | Site: Abdomen

## 2016-12-04 MED ORDER — ACETAMINOPHEN 160 MG/5ML PO SOLN
325.0000 mg | ORAL | Status: DC | PRN
  Administered 2016-12-05 (×2): 325 mg via ORAL
  Filled 2016-12-04 (×2): qty 20.3

## 2016-12-04 MED ORDER — CHLORHEXIDINE GLUCONATE CLOTH 2 % EX PADS: 6.0000 | MEDICATED_PAD | Freq: Once | CUTANEOUS | Status: DC

## 2016-12-04 MED ORDER — EPHEDRINE 5 MG/ML INJ
INTRAVENOUS | Status: AC
Start: 2016-12-04 — End: 2016-12-04
  Filled 2016-12-04: qty 10

## 2016-12-04 MED ORDER — PHENYLEPHRINE 40 MCG/ML (10ML) SYRINGE FOR IV PUSH (FOR BLOOD PRESSURE SUPPORT)
PREFILLED_SYRINGE | INTRAVENOUS | Status: AC
Start: 2016-12-04 — End: 2016-12-04
  Filled 2016-12-04: qty 10

## 2016-12-04 MED ORDER — MIDAZOLAM HCL 2 MG/2ML IJ SOLN
INTRAMUSCULAR | Status: AC
  Filled 2016-12-04: qty 2

## 2016-12-04 MED ORDER — BUPIVACAINE LIPOSOME 1.3 % IJ SUSP
20.0000 mL | Freq: Once | INTRAMUSCULAR | Status: AC
  Administered 2016-12-04: 20 mL
  Filled 2016-12-04: qty 20

## 2016-12-04 MED ORDER — FENTANYL CITRATE (PF) 100 MCG/2ML IJ SOLN
INTRAMUSCULAR | Status: DC | PRN
  Administered 2016-12-04: 100 ug via INTRAVENOUS
  Administered 2016-12-04 (×3): 50 ug via INTRAVENOUS

## 2016-12-04 MED ORDER — PROMETHAZINE HCL 25 MG/ML IJ SOLN
INTRAMUSCULAR | Status: AC
  Filled 2016-12-04: qty 1

## 2016-12-04 MED ORDER — PROPOFOL 10 MG/ML IV BOLUS
INTRAVENOUS | Status: AC
  Filled 2016-12-04: qty 20

## 2016-12-04 MED ORDER — CEFOTETAN DISODIUM-DEXTROSE 2-2.08 GM-% IV SOLR: 2.0000 g | INTRAVENOUS | Status: DC

## 2016-12-04 MED ORDER — HEPARIN SODIUM (PORCINE) 5000 UNIT/ML IJ SOLN
5000.0000 [IU] | Freq: Three times a day (TID) | INTRAMUSCULAR | Status: DC
  Administered 2016-12-04 – 2016-12-06 (×7): 5000 [IU] via SUBCUTANEOUS
  Filled 2016-12-04 (×7): qty 1

## 2016-12-04 MED ORDER — EPHEDRINE SULFATE 50 MG/ML IJ SOLN
INTRAMUSCULAR | Status: DC | PRN
  Administered 2016-12-04: 10 mg via INTRAVENOUS

## 2016-12-04 MED ORDER — SUGAMMADEX SODIUM 200 MG/2ML IV SOLN
INTRAVENOUS | Status: DC | PRN
  Administered 2016-12-04: 500 mg via INTRAVENOUS

## 2016-12-04 MED ORDER — APREPITANT 40 MG PO CAPS
40.0000 mg | ORAL_CAPSULE | ORAL | Status: AC
  Administered 2016-12-04: 40 mg via ORAL
  Filled 2016-12-04: qty 1

## 2016-12-04 MED ORDER — GABAPENTIN 300 MG PO CAPS
300.0000 mg | ORAL_CAPSULE | ORAL | Status: AC
  Administered 2016-12-04: 300 mg via ORAL
  Filled 2016-12-04: qty 1

## 2016-12-04 MED ORDER — CELECOXIB 200 MG PO CAPS
400.0000 mg | ORAL_CAPSULE | ORAL | Status: AC
  Administered 2016-12-04: 400 mg via ORAL
  Filled 2016-12-04: qty 2

## 2016-12-04 MED ORDER — FENTANYL CITRATE (PF) 250 MCG/5ML IJ SOLN
INTRAMUSCULAR | Status: AC
  Filled 2016-12-04: qty 5

## 2016-12-04 MED ORDER — PHENYLEPHRINE HCL 10 MG/ML IJ SOLN
INTRAMUSCULAR | Status: DC | PRN
  Administered 2016-12-04: 40 ug via INTRAVENOUS
  Administered 2016-12-04: 80 ug via INTRAVENOUS

## 2016-12-04 MED ORDER — LACTATED RINGERS IV SOLN
INTRAVENOUS | Status: DC | PRN
  Administered 2016-12-04: 07:00:00 via INTRAVENOUS

## 2016-12-04 MED ORDER — SODIUM CHLORIDE 0.9 % IJ SOLN
INTRAMUSCULAR | Status: AC
  Filled 2016-12-04: qty 10

## 2016-12-04 MED ORDER — DEXAMETHASONE SODIUM PHOSPHATE 10 MG/ML IJ SOLN
INTRAMUSCULAR | Status: DC | PRN
  Administered 2016-12-04: 10 mg via INTRAVENOUS

## 2016-12-04 MED ORDER — ONDANSETRON HCL 4 MG/2ML IJ SOLN
4.0000 mg | INTRAMUSCULAR | Status: DC | PRN
  Administered 2016-12-05 (×3): 4 mg via INTRAVENOUS
  Filled 2016-12-04 (×3): qty 2

## 2016-12-04 MED ORDER — ONDANSETRON HCL 4 MG/2ML IJ SOLN
INTRAMUSCULAR | Status: DC | PRN
  Administered 2016-12-04: 4 mg via INTRAVENOUS

## 2016-12-04 MED ORDER — ONDANSETRON HCL 4 MG/2ML IJ SOLN
4.0000 mg | Freq: Once | INTRAMUSCULAR | Status: AC | PRN
  Administered 2016-12-04: 4 mg via INTRAVENOUS

## 2016-12-04 MED ORDER — PREMIER PROTEIN SHAKE
2.0000 [oz_av] | ORAL | Status: DC
  Administered 2016-12-05 – 2016-12-06 (×13): 2 [oz_av] via ORAL

## 2016-12-04 MED ORDER — SCOPOLAMINE 1 MG/3DAYS TD PT72
1.0000 | MEDICATED_PATCH | TRANSDERMAL | Status: DC
  Administered 2016-12-04: 1.5 mg via TRANSDERMAL
  Filled 2016-12-04: qty 1

## 2016-12-04 MED ORDER — CEFAZOLIN SODIUM-DEXTROSE 2-3 GM-% IV SOLR
INTRAVENOUS | Status: DC | PRN
  Administered 2016-12-04: 2 g via INTRAVENOUS

## 2016-12-04 MED ORDER — LACTATED RINGERS IV SOLN
INTRAVENOUS | Status: DC
  Administered 2016-12-04: 11:00:00 via INTRAVENOUS

## 2016-12-04 MED ORDER — KCL IN DEXTROSE-NACL 20-5-0.45 MEQ/L-%-% IV SOLN
INTRAVENOUS | Status: DC
  Administered 2016-12-04 – 2016-12-06 (×5): via INTRAVENOUS
  Filled 2016-12-04 (×7): qty 1000

## 2016-12-04 MED ORDER — DEXAMETHASONE SODIUM PHOSPHATE 4 MG/ML IJ SOLN: 4.0000 mg | INTRAMUSCULAR | Status: DC

## 2016-12-04 MED ORDER — HYDROMORPHONE HCL-NACL 0.5-0.9 MG/ML-% IV SOSY
PREFILLED_SYRINGE | INTRAVENOUS | Status: AC
  Filled 2016-12-04: qty 2

## 2016-12-04 MED ORDER — PROPOFOL 10 MG/ML IV BOLUS
INTRAVENOUS | Status: DC | PRN
  Administered 2016-12-04: 200 mg via INTRAVENOUS

## 2016-12-04 MED ORDER — HYDRALAZINE HCL 20 MG/ML IJ SOLN: 10.0000 mg | INTRAMUSCULAR | Status: DC | PRN

## 2016-12-04 MED ORDER — ONDANSETRON HCL 4 MG/2ML IJ SOLN
INTRAMUSCULAR | Status: AC
  Filled 2016-12-04: qty 2

## 2016-12-04 MED ORDER — ROCURONIUM BROMIDE 100 MG/10ML IV SOLN
INTRAVENOUS | Status: DC | PRN
  Administered 2016-12-04: 50 mg via INTRAVENOUS
  Administered 2016-12-04 (×2): 20 mg via INTRAVENOUS

## 2016-12-04 MED ORDER — SUCCINYLCHOLINE CHLORIDE 200 MG/10ML IV SOSY
PREFILLED_SYRINGE | INTRAVENOUS | Status: AC
Start: 2016-12-04 — End: 2016-12-04
  Filled 2016-12-04: qty 10

## 2016-12-04 MED ORDER — HEPARIN SODIUM (PORCINE) 5000 UNIT/ML IJ SOLN
5000.0000 [IU] | INTRAMUSCULAR | Status: AC
  Administered 2016-12-04: 5000 [IU] via SUBCUTANEOUS
  Filled 2016-12-04: qty 1

## 2016-12-04 MED ORDER — HYDROMORPHONE HCL-NACL 0.5-0.9 MG/ML-% IV SOSY
0.2500 mg | PREFILLED_SYRINGE | INTRAVENOUS | Status: DC | PRN
  Administered 2016-12-04 (×2): 0.5 mg via INTRAVENOUS

## 2016-12-04 MED ORDER — PROMETHAZINE HCL 25 MG/ML IJ SOLN
6.2500 mg | INTRAMUSCULAR | Status: DC | PRN
  Administered 2016-12-04: 6.25 mg via INTRAVENOUS

## 2016-12-04 MED ORDER — LACTATED RINGERS IR SOLN
Status: DC | PRN
  Administered 2016-12-04: 1000 mL

## 2016-12-04 MED ORDER — OXYCODONE HCL 5 MG/5ML PO SOLN
5.0000 mg | ORAL | Status: DC | PRN
  Administered 2016-12-05 (×3): 5 mg via ORAL
  Filled 2016-12-04 (×3): qty 5

## 2016-12-04 MED ORDER — SUCCINYLCHOLINE CHLORIDE 200 MG/10ML IV SOSY
PREFILLED_SYRINGE | INTRAVENOUS | Status: DC | PRN
  Administered 2016-12-04: 120 mg via INTRAVENOUS

## 2016-12-04 MED ORDER — MIDAZOLAM HCL 5 MG/5ML IJ SOLN
INTRAMUSCULAR | Status: DC | PRN
  Administered 2016-12-04: 2 mg via INTRAVENOUS

## 2016-12-04 MED ORDER — CEFOTETAN DISODIUM-DEXTROSE 2-2.08 GM-% IV SOLR
INTRAVENOUS | Status: AC
  Filled 2016-12-04: qty 50

## 2016-12-04 MED ORDER — MORPHINE SULFATE (PF) 2 MG/ML IV SOLN
1.0000 mg | INTRAVENOUS | Status: DC | PRN
  Administered 2016-12-04: 2 mg via INTRAVENOUS
  Filled 2016-12-04: qty 1

## 2016-12-04 MED ORDER — SODIUM CHLORIDE 0.9 % IJ SOLN
INTRAMUSCULAR | Status: DC | PRN
  Administered 2016-12-04: 10 mL

## 2016-12-04 MED ORDER — 0.9 % SODIUM CHLORIDE (POUR BTL) OPTIME
TOPICAL | Status: DC | PRN
  Administered 2016-12-04: 1000 mL

## 2016-12-04 MED ORDER — ACETAMINOPHEN 500 MG PO TABS
1000.0000 mg | ORAL_TABLET | ORAL | Status: AC
  Administered 2016-12-04: 1000 mg via ORAL
  Filled 2016-12-04: qty 2

## 2016-12-04 MED ORDER — PANTOPRAZOLE SODIUM 40 MG IV SOLR
40.0000 mg | Freq: Every day | INTRAVENOUS | Status: DC
  Administered 2016-12-04 – 2016-12-05 (×2): 40 mg via INTRAVENOUS
  Filled 2016-12-04 (×2): qty 40

## 2016-12-04 MED ORDER — LIDOCAINE HCL (CARDIAC) 20 MG/ML IV SOLN
INTRAVENOUS | Status: DC | PRN
  Administered 2016-12-04: 100 mg via INTRAVENOUS

## 2016-12-04 MED ORDER — ACETAMINOPHEN 325 MG PO TABS: 650.0000 mg | ORAL_TABLET | ORAL | Status: DC | PRN

## 2016-12-04 SURGICAL SUPPLY — 61 items
APPLICATOR COTTON TIP 6IN STRL (MISCELLANEOUS) IMPLANT
APPLIER CLIP 5 13 M/L LIGAMAX5 (MISCELLANEOUS)
APPLIER CLIP ROT 10 11.4 M/L (STAPLE)
APPLIER CLIP ROT 13.4 12 LRG (CLIP)
BLADE SURG 15 STRL LF DISP TIS (BLADE) ×1 IMPLANT
BLADE SURG 15 STRL SS (BLADE) ×2
CABLE HIGH FREQUENCY MONO STRZ (ELECTRODE) ×3 IMPLANT
CLIP APPLIE 5 13 M/L LIGAMAX5 (MISCELLANEOUS) IMPLANT
CLIP APPLIE ROT 10 11.4 M/L (STAPLE) IMPLANT
CLIP APPLIE ROT 13.4 12 LRG (CLIP) IMPLANT
DERMABOND ADVANCED (GAUZE/BANDAGES/DRESSINGS) ×2
DERMABOND ADVANCED .7 DNX12 (GAUZE/BANDAGES/DRESSINGS) ×1 IMPLANT
DEVICE PMI PUNCTURE CLOSURE (MISCELLANEOUS) ×3 IMPLANT
DEVICE SUT QUICK LOAD TK 5 (STAPLE) IMPLANT
DEVICE SUT TI-KNOT TK 5X26 (MISCELLANEOUS) IMPLANT
DEVICE SUTURE ENDOST 10MM (ENDOMECHANICALS) IMPLANT
DEVICE TI KNOT TK5 (MISCELLANEOUS)
DISSECTOR BLUNT TIP ENDO 5MM (MISCELLANEOUS) IMPLANT
ELECT REM PT RETURN 15FT ADLT (MISCELLANEOUS) ×3 IMPLANT
GAUZE SPONGE 4X4 12PLY STRL (GAUZE/BANDAGES/DRESSINGS) IMPLANT
GLOVE BIO SURGEON STRL SZ8 (GLOVE) ×3 IMPLANT
GOWN STRL REUS W/TWL XL LVL3 (GOWN DISPOSABLE) ×12 IMPLANT
HANDLE STAPLE EGIA 4 XL (STAPLE) ×3 IMPLANT
HOVERMATT SINGLE USE (MISCELLANEOUS) ×3 IMPLANT
KIT BASIN OR (CUSTOM PROCEDURE TRAY) ×3 IMPLANT
KIT DEFENDO BUTTON (KITS) ×3 IMPLANT
MARKER SKIN DUAL TIP RULER LAB (MISCELLANEOUS) ×3 IMPLANT
NEEDLE SPNL 22GX3.5 QUINCKE BK (NEEDLE) ×3 IMPLANT
PACK UNIVERSAL I (CUSTOM PROCEDURE TRAY) ×3 IMPLANT
QUICK LOAD TK 5 (STAPLE)
RELOAD TRI 45 ART MED THCK BLK (STAPLE) ×3 IMPLANT
RELOAD TRI 45 ART MED THCK PUR (STAPLE) ×3 IMPLANT
RELOAD TRI 60 ART MED THCK BLK (STAPLE) ×9 IMPLANT
RELOAD TRI 60 ART MED THCK PUR (STAPLE) IMPLANT
SCISSORS LAP 5X45 EPIX DISP (ENDOMECHANICALS) IMPLANT
SET IRRIG TUBING LAPAROSCOPIC (IRRIGATION / IRRIGATOR) ×3 IMPLANT
SHEARS HARMONIC ACE PLUS 45CM (MISCELLANEOUS) ×3 IMPLANT
SLEEVE ADV FIXATION 5X100MM (TROCAR) ×6 IMPLANT
SLEEVE GASTRECTOMY 36FR VISIGI (MISCELLANEOUS) ×3 IMPLANT
SOLUTION ANTI FOG 6CC (MISCELLANEOUS) ×3 IMPLANT
SPONGE LAP 18X18 X RAY DECT (DISPOSABLE) ×3 IMPLANT
STAPLER VISISTAT 35W (STAPLE) ×3 IMPLANT
SUT MNCRL AB 4-0 PS2 18 (SUTURE) ×3 IMPLANT
SUT SURGIDAC NAB ES-9 0 48 120 (SUTURE) IMPLANT
SUT VIC AB 4-0 SH 18 (SUTURE) ×3 IMPLANT
SUT VICRYL 0 TIES 12 18 (SUTURE) ×3 IMPLANT
SYR 10ML ECCENTRIC (SYRINGE) ×3 IMPLANT
SYR 20CC LL (SYRINGE) ×3 IMPLANT
SYR 50ML LL SCALE MARK (SYRINGE) ×3 IMPLANT
TOWEL OR 17X26 10 PK STRL BLUE (TOWEL DISPOSABLE) ×6 IMPLANT
TOWEL OR NON WOVEN STRL DISP B (DISPOSABLE) ×3 IMPLANT
TRAY FOLEY W/METER SILVER 16FR (SET/KITS/TRAYS/PACK) IMPLANT
TROCAR ADV FIXATION 5X100MM (TROCAR) ×3 IMPLANT
TROCAR BLADELESS 15MM (ENDOMECHANICALS) ×3 IMPLANT
TROCAR BLADELESS OPT 5 100 (ENDOMECHANICALS) ×3 IMPLANT
TUBE CALIBRATION LAPBAND (TUBING) IMPLANT
TUBING CONNECTING 10 (TUBING) ×4 IMPLANT
TUBING CONNECTING 10' (TUBING) ×2
TUBING ENDO SMARTCAP (MISCELLANEOUS) ×3 IMPLANT
TUBING ENDO SMARTCAP PENTAX (MISCELLANEOUS) ×3 IMPLANT
TUBING INSUF HEATED (TUBING) ×3 IMPLANT

## 2016-12-04 NOTE — Discharge Instructions (Signed)
° ° ° °GASTRIC BYPASS/SLEEVE ° Home Care Instructions ° ° These instructions are to help you care for yourself when you go home. ° °Call: If you have any problems. °• Call [phone] and ask for the surgeon on call °• If you need immediate assistance come to the ER at [facility]. Tell the ER staff you are a new post-op gastric bypass or gastric sleeve patient  °Signs and symptoms to report: • Severe  vomiting or nausea °o If you cannot handle clear liquids for longer than 1 day, call your surgeon °• Abdominal pain which does not get better after taking your pain medication °• Fever greater than 100.4°  F and chills °• Heart rate over 100 beats a minute °• Trouble breathing °• Chest pain °• Redness,  swelling, drainage, or foul odor at incision (surgical) sites °• If your incisions open or pull apart °• Swelling or pain in calf (lower leg) °• Diarrhea (Loose bowel movements that happen often), frequent watery, uncontrolled bowel movements °• Constipation, (no bowel movements for 3 days) if this happens: °o Take Milk of Magnesia, 2 tablespoons by mouth, 3 times a day for 2 days if needed °o Stop taking Milk of Magnesia once you have had a bowel movement °o Call your doctor if constipation continues °Or °o Take Miralax  (instead of Milk of Magnesia) following the label instructions °o Stop taking Miralax once you have had a bowel movement °o Call your doctor if constipation continues °• Anything you think is “abnormal for you” °  °Normal side effects after surgery: • Unable to sleep at night or unable to concentrate °• Irritability °• Being tearful (crying) or depressed ° °These are common complaints, possibly related to your anesthesia, stress of surgery, and change in lifestyle, that usually go away a few weeks after surgery. If these feelings continue, call your medical doctor.  °Wound Care: You may have surgical glue, steri-strips, or staples over your incisions after surgery °• Surgical glue: Looks like clear  film over your incisions and will wear off a little at a time °• Steri-strips: Adhesive strips of tape over your incisions. You may notice a yellowish color on skin under the steri-strips. This is used to make the steri-strips stick better. Do not pull the steri-strips off - let them fall off °• Staples: Staples may be removed before you leave the hospital °o If you go home with staples, call [facility] for an appointment with your surgeon’s nurse to have staples removed 10 days after surgery, [phone] °• Showering: You may shower two (2) days after your surgery unless your surgeon tells you differently °o Wash gently around incisions with warm soapy water, rinse well, and gently pat dry °o If you have a drain (tube from your incision), you may need someone to hold this while you shower °o No tub baths until staples are removed and incisions are healed °  °Medications: • Medications should be liquid or crushed if larger than the size of a dime °• Extended release pills (medication that releases a little bit at a time through the  day) should not be crushed °• Depending on the size and number of medications you take, you may need to space (take a few throughout the day)/change the time you take your medications so that you do not over-fill your pouch (smaller stomach) °• Make sure you follow-up with you primary care physician to make medication changes needed during rapid weight loss and life -style changes °•   If you have diabetes, follow up with your doctor that orders your diabetes medication(s) within one week after surgery and check your blood sugar regularly ° °• Do not drive while taking narcotics (pain medications) ° °• Do not take acetaminophen (Tylenol) and Roxicet or Lortab Elixir at the same time since these pain medications contain acetaminophen °  °Diet:  °First 2 Weeks You will see the nutritionist about two (2) weeks after your surgery. The nutritionist will increase the types of  foods you can eat if you are handling liquids well: °• If you have severe vomiting or nausea and cannot handle clear liquids lasting longer than 1 day call your surgeon °Protein Shake °• Drink at least 2 ounces of shake 5-6 times per day °• Each serving of protein shakes (usually 8-12 ounces) should have a minimum of: °o 15 grams of protein °o And no more than 5 grams of carbohydrate °• Goal for protein each day: °o Men = 80 grams per day °o Women = 60 grams per day °  ° • Protein powder may be added to fluids such as non-fat milk or Lactaid milk or Soy milk (limit to 35 grams added protein powder per serving) ° °Hydration °• Slowly increase the amount of water and other clear liquids as tolerated (See Acceptable Fluids) °• Slowly increase the amount of protein shake as tolerated °• Sip fluids slowly and throughout the day °• May use sugar substitutes in small amounts (no more than 6-8 packets per day; i.e. Splenda) ° °Fluid Goal °• The first goal is to drink at least 8 ounces of protein shake/drink per day (or as directed by the nutritionist); some examples of protein shakes are Syntrax Nectar, Adkins Advantage, EAS Edge HP, and Unjury. - See handout from pre-op Bariatric Education Class: °o Slowly increase the amount of protein shake you drink as tolerated °o You may find it easier to slowly sip shakes throughout the day °o It is important to get your proteins in first °• Your fluid goal is to drink 64-100 ounces of fluid daily °o It may take a few weeks to build up to this  °• 32 oz. (or more) should be clear liquids °And °• 32 oz. (or more) should be full liquids (see below for examples) °• Liquids should not contain sugar, caffeine, or carbonation ° °Clear Liquids: °• Water of Sugar-free flavored water (i.e. Fruit H²O, Propel) °• Decaffeinated coffee or tea (sugar-free) °• Crystal lite, Wyler’s Lite, Minute Maid Lite °• Sugar-free Jell-O °• Bouillon or broth °• Sugar-free Popsicle:    - Less than 20 calories  each; Limit 1 per day ° °Full Liquids: °                  Protein Shakes/Drinks + 2 choices per day of other full liquids °• Full liquids must be: °o No More Than 12 grams of Carbs per serving °o No More Than 3 grams of Fat per serving °• Strained low-fat cream soup °• Non-Fat milk °• Fat-free Lactaid Milk °• Sugar-free yogurt (Dannon Lite & Fit, Greek yogurt) ° °  °Vitamins and Minerals • Start 1 day after surgery unless otherwise directed by your surgeon °• 2 Chewable Bariatric Multivitamin / Multimineral Supplement with iron °• Chewable Calcium Citrate with Vitamin D-3 °(Example: 3 Chewable Calcium  Plus 600 with Vitamin D-3) °o Take 500 mg three (3) times a day for a total of 1500 mg each day °o Do not take all 3 doses of calcium   at one time as it may cause constipation, and you can only absorb 500 mg at a time °o Do not mix multivitamins containing iron with calcium supplements;  take 2 hours apart °• Menstruating women and those at risk for anemia ( a blood disease that causes weakness) may need extra iron °o Talk to your doctor to see if you need more iron °• If you need extra iron: Total daily Iron recommendation (including Vitamins) is 50 to 100 mg Iron/day °• Do not stop taking or change any vitamins or minerals until you talk to your nutritionist or surgeon °• Your nutritionist and/or surgeon must approve all vitamin and mineral supplements °  °Activity and Exercise: It is important to continue walking at home. Limit your physical activity as instructed by your doctor. During this time, use these guidelines: °• Do not lift anything greater than ten  (10) pounds for at least two (2) weeks °• Do not go back to work or drive until your surgeon says you can °• You may have sex when you feel comfortable °o It is VERY important for female patients to use a reliable birth control method; fertility often increase after surgery °o Do not get pregnant for at least 18 months °• Start exercising as soon as your  doctor tells you that you can °o Make sure your doctor approves any physical activity °• Start with a simple walking program °• Walk 5-15 minutes each day, 7 days per week °• Slowly increase until you are walking 30-45 minutes per day °• Consider joining our BELT program. [phone] or email [email or web address] °  °Special Instructions Things to remember: °• Use your CPAP when sleeping if this applies to you °• Consider buying a medical alert bracelet that says you had lap-band surgery °  °  You will likely have your first fill (fluid added to your band) 6 - 8 weeks after surgery °• [facility] has a free Bariatric Surgery Support Group that meets monthly, the 3rd Thursday, 6pm. [facility] Classrooms. You can see classes online at [email or web address] °• It is very important to keep all follow up appointments with your surgeon, nutritionist, primary care physician, and behavioral health practitioner °o After the first year, please follow up with your bariatric surgeon and nutritionist at least once a year in order to maintain best weight loss results °      °             [facility]:  [phone] ° °             [facility]: [phone] ° °             Bariatric Nurse Coordinator: [phone]  °Gastric Bypass/Sleeve Home Care Instructions  Rev. 04/2012    ° °                                                    Reviewed and Endorsed °                                                   by [facility] Patient Education Committee, Jan, 2014 ° ° ° ° ° ° ° ° ° °

## 2016-12-04 NOTE — Anesthesia Procedure Notes (Signed)
Procedure Name: Intubation Date/Time: 12/04/2016 7:35 AM Performed by: Carleene Cooper A Pre-anesthesia Checklist: Patient identified, Emergency Drugs available, Suction available and Patient being monitored Patient Re-evaluated:Patient Re-evaluated prior to induction Oxygen Delivery Method: Circle system utilized Preoxygenation: Pre-oxygenation with 100% oxygen Induction Type: IV induction Ventilation: Mask ventilation without difficulty Laryngoscope Size: Mac and 3 Grade View: Grade I Tube type: Oral Tube size: 7.5 mm Number of attempts: 1 Airway Equipment and Method: Stylet and Oral airway Placement Confirmation: ETT inserted through vocal cords under direct vision,  positive ETCO2 and breath sounds checked- equal and bilateral Tube secured with: Tape Dental Injury: Teeth and Oropharynx as per pre-operative assessment

## 2016-12-04 NOTE — Anesthesia Postprocedure Evaluation (Signed)
Anesthesia Post Note  Patient: Tracy Brooks  Procedure(s) Performed: Procedure(s) (LRB): LAPAROSCOPIC GASTRIC SLEEVE RESECTION WITH UPPER ENDOSCOPY (N/A)     Patient location during evaluation: PACU Anesthesia Type: General Level of consciousness: awake and alert Pain management: pain level controlled Vital Signs Assessment: post-procedure vital signs reviewed and stable Respiratory status: spontaneous breathing, nonlabored ventilation, respiratory function stable and patient connected to nasal cannula oxygen Cardiovascular status: blood pressure returned to baseline and stable Postop Assessment: no apparent nausea or vomiting Anesthetic complications: no    Last Vitals:  Vitals:   12/04/16 1100 12/04/16 1115  BP: 140/76 137/71  Pulse: 80 72  Resp: 16 11  Temp:    SpO2: 100% 100%    Last Pain:  Vitals:   12/04/16 1100  TempSrc:   PainSc: 5                  Kennieth Rad

## 2016-12-04 NOTE — Op Note (Signed)
Surgeon: Wenda Low, MD, FACS  Asst:  Phylliss Blakes, MD  Anes:  General endotracheal  Procedure: Laparoscopic sleeve gastrectomy and upper endoscopy  Diagnosis: Morbid obesity  Complications: None noted  EBL:   5 cc  Description of Procedure:  The patient was take to OR 2 and given general anesthesia.  The abdomen was prepped with Technicare and draped sterilely.  A timeout was performed.  Access to the abdomen was achieved with a 5 mm Optiview through the left upper quadrant.  Following insufflation, the state of the abdomen was found to be free of adhesions.  Balloon test with 10 cc of air was conducted and was negative for a hiatal hernia.  The ViSiGi 36Fr tube was inserted to deflate the stomach and was pulled back into the esophagus.    The pylorus was identified and we measured 5 cm back and marked the antrum.  At that point we began dissection to take down the greater curvature of the stomach using the Harmonic scalpel.  This dissection was taken all the way up to the left crus.  Posterior attachments of the stomach were also taken down.    The ViSiGi tube was then passed into the antrum and suction applied so that it was snug along the lessor curvature.  The "crow's foot" or incisura was identified.  The sleeve gastrectomy was begun using the [employer] stapler beginning with a  4.5 cm black load with TRS followed by a 6 cm black load with TRS.  This was followed by purple loads with TRS.  When the sleeve was complete the tube was taken off suction and insufflated briefly.  The tube was withdrawn.  Upper endoscopy was then performed by Dr. Fredricka Bonine which showed a good cylinder.  .     The specimen was extracted through the 15 trocar site. The 15 mm port was closed with the PMI device.   Wounds were infiltrated with Exparel  and closed with 4-0 monocryl and Dermabond.    Matt B. Daphine Deutscher, MD, [facility], [place] [phone]

## 2016-12-04 NOTE — Op Note (Signed)
Preoperative diagnosis: laparoscopic sleeve gastrectomy  Postoperative diagnosis: Same   Procedure: Upper endoscopy   Surgeon: Berna Bue, M.D.  Anesthesia: Gen.   Indications for procedure: This patient was undergoing a laparoscopic sleeve gastrectomy.   Description of procedure: The endoscopy was placed in the mouth and into the oropharynx and under endoscopic vision it was advanced to the esophagogastric junction. The pouch was insufflated and no bleeding or bubbles were seen. The pouch was free of any angulation or undue narrowing. There was a small amount of blood in the proximal stomach but no active bleeding or leaks were detected. The scope was withdrawn without difficulty.   Berna Bue M.D. General, Bariatric, & Minimally Invasive Surgery [facility]

## 2016-12-04 NOTE — Interval H&P Note (Signed)
History and Physical Interval Note:  12/04/2016 7:22 AM  Tracy Brooks  has presented today for surgery, with the diagnosis of MORBID OBESITY  The various methods of treatment have been discussed with the patient and family. After consideration of risks, benefits and other options for treatment, the patient has consented to  Procedure(s): LAPAROSCOPIC GASTRIC SLEEVE RESECTION WITH UPPER ENDO (N/A) as a surgical intervention .  The patient's history has been reviewed, patient examined, no change in status, stable for surgery.  I have reviewed the patient's chart and labs.  Questions were answered to the patient's satisfaction.     Ottilia Pippenger B

## 2016-12-04 NOTE — H&P (View-Only) (Signed)
Tracy Brooks Location: [facility] Patient #: [id number] DOB: 1990/11/10 Single / Language: Tracy Brooks / Race: White Female  History of Present Illness The patient is a 26 year old female who presents for a bariatric surgery evaluation. The patient has attended a seminar and is interested in a sleeve gastrectomy. She is 26 years old and is still with lifelong obesity. When she reached 290 pounds she had a tipping point where she decided she wanted investigate surgery. She has done a lot including very heavy physical work outs and the [employer] along with dieting and these are short-lived. She came with her mom. Today's weight is 288 with a BMI of 52.6. She is 5 feet 2 inches. She denies obstructive sleep apnea. She does have occasional GERD. She has never had DVT. She's never had abdominal surgery.  I reviewed sleeve gastrectomy with her in detail including complications notl limited to bleeding and leaks. She has a good knowledge of the procedure its risks and indications. I think she would be a good candidate for sleeve gastrectomy and we'll begin the workup for this.  Past Surgical History Tonsillectomy   Diagnostic Studies History  Colonoscopy  never Pap Smear  1-5 years ago  Allergies  Adhesive Tape  Allergies Reconciled   Medication History Albuterol Sulfate ((2.5 MG/3ML)0.083% Nebulized Soln, Inhalation) Active. Skyla (13.5MG  IUD, Intrauterine) Active. Medications Reconciled  Social History Alcohol use  Occasional alcohol use. Caffeine use  Carbonated beverages, Coffee, Tea. Tobacco use  Former smoker.  Family History  Breast Cancer  Family Members In General. Cancer  Family Members In General. Depression  Father, Mother. Diabetes Mellitus  Family Members In General, Mother. Hypertension  Family Members In General. Kidney Disease  Family Members In General. Malignant Neoplasm Of Pancreas  Family Members In General. Migraine Headache   Family Members In General. Prostate Cancer  Family Members In General. Respiratory Condition  Family Members In General. Thyroid problems  Family Members In General, Father.  Pregnancy / Birth History Age at menarche  14 years. Contraceptive History  Intrauterine device. Gravida  0 Irregular periods  Para  0  Other Problems Asthma  Back Pain  Depression  Gastroesophageal Reflux Disease  Migraine Headache     Review of Systems General Present- Fatigue. Not Present- Appetite Loss, Chills, Fever, Night Sweats, Weight Gain and Weight Loss. Skin Not Present- Change in Wart/Mole, Dryness, Hives, Jaundice, New Lesions, Non-Healing Wounds, Rash and Ulcer. HEENT Present- Seasonal Allergies. Not Present- Earache, Hearing Loss, Hoarseness, Nose Bleed, Oral Ulcers, Ringing in the Ears, Sinus Pain, Sore Throat, Visual Disturbances, Wears glasses/contact lenses and Yellow Eyes. Respiratory Not Present- Bloody sputum, Chronic Cough, Difficulty Breathing, Snoring and Wheezing. Breast Not Present- Breast Mass, Breast Pain, Nipple Discharge and Skin Changes. Cardiovascular Not Present- Chest Pain, Difficulty Breathing Lying Down, Leg Cramps, Palpitations, Rapid Heart Rate, Shortness of Breath and Swelling of Extremities. Gastrointestinal Not Present- Abdominal Pain, Bloating, Bloody Stool, Change in Bowel Habits, Chronic diarrhea, Constipation, Difficulty Swallowing, Excessive gas, Gets full quickly at meals, Hemorrhoids, Indigestion, Nausea, Rectal Pain and Vomiting. Female Genitourinary Not Present- Frequency, Nocturia, Painful Urination, Pelvic Pain and Urgency. Neurological Present- Headaches. Not Present- Decreased Memory, Fainting, Numbness, Seizures, Tingling, Tremor, Trouble walking and Weakness. Psychiatric Present- Anxiety. Not Present- Bipolar, Change in Sleep Pattern, Depression, Fearful and Frequent crying. Endocrine Not Present- Cold Intolerance, Excessive Hunger, Hair  Changes, Heat Intolerance, Hot flashes and New Diabetes. Hematology Not Present- Blood Thinners, Easy Bruising, Excessive bleeding, Gland problems, HIV and Persistent Infections.  Vitals  Weight: 288 lb Height: 62in Body Surface Area: 2.23 m Body Mass Index: 52.68 kg/m  Temp.: 98.27F  Pulse: 104 (Regular)  BP: 122/90 (Sitting, Left Arm, Standard)   Physical Exam  General Note: WDobese WF NAD HEENT unremarkable Neck supple Chest clear Abdomen nontender and obese Ext FROM without edema; no hx of DVT   Assessment & Plan Molli Hazard(Amry Cathy B. Daphine DeutscherMartin MD; 07/26/2016 5:02 PM) MORBID OBESITY, UNSPECIFIED OBESITY TYPE (E66.01)  Plan:  Lap sleeve gastrectomy;  UGI showed no hiatal hernia  Matt B. Daphine DeutscherMartin, MD. Carilyn GoodpastureFACS

## 2016-12-04 NOTE — Anesthesia Preprocedure Evaluation (Addendum)
Anesthesia Evaluation  Patient identified by MRN, date of birth, ID band Patient awake    Reviewed: Allergy & Precautions, NPO status , Patient's Chart, lab work & pertinent test results  Airway Mallampati: II  TM Distance: >3 FB     Dental   Pulmonary asthma ,    breath sounds clear to auscultation       Cardiovascular negative cardio ROS   Rhythm:Regular Rate:Normal     Neuro/Psych  Headaches,    GI/Hepatic Neg liver ROS, GI history noted. CG   Endo/Other  negative endocrine ROS  Renal/GU negative Renal ROS     Musculoskeletal   Abdominal   Peds  Hematology   Anesthesia Other Findings   Reproductive/Obstetrics                             Anesthesia Physical Anesthesia Plan  ASA: II  Anesthesia Plan: General   Post-op Pain Management:    Induction: Intravenous  PONV Risk Score and Plan: 3 and Ondansetron, Dexamethasone, Midazolam, Propofol infusion and Treatment may vary due to age or medical condition  Airway Management Planned:   Additional Equipment:   Intra-op Plan:   Post-operative Plan: Possible Post-op intubation/ventilation  Informed Consent: I have reviewed the patients History and Physical, chart, labs and discussed the procedure including the risks, benefits and alternatives for the proposed anesthesia with the patient or authorized representative who has indicated his/her understanding and acceptance.   Dental advisory given  Plan Discussed with: CRNA and Anesthesiologist  Anesthesia Plan Comments:         Anesthesia Quick Evaluation

## 2016-12-04 NOTE — Progress Notes (Addendum)
Patient resting, family at bedside.  Discussed patient goals for post op day including ambulation, IS, and diet progression with stable vital signs.  Questions answered.

## 2016-12-04 NOTE — Transfer of Care (Signed)
Immediate Anesthesia Transfer of Care Note  Patient: Tracy Brooks  Procedure(s) Performed: Procedure(s): LAPAROSCOPIC GASTRIC SLEEVE RESECTION WITH UPPER ENDOSCOPY (N/A)  Patient Location: PACU  Anesthesia Type:General  Level of Consciousness: awake, alert , oriented and patient cooperative  Airway & Oxygen Therapy: Patient Spontanous Breathing and Patient connected to face mask oxygen  Post-op Assessment: Report given to RN, Post -op Vital signs reviewed and stable and Patient moving all extremities  Post vital signs: Reviewed and stable  Last Vitals:  Vitals:   12/04/16 0505  BP: (!) 120/94  Pulse: 89  Resp: 18  Temp: 36.7 C  SpO2: 99%    Last Pain:  Vitals:   12/04/16 0505  TempSrc: Oral      Patients Stated Pain Goal: 3 (12/04/16 0530)  Complications: No apparent anesthesia complications

## 2016-12-05 LAB — CBC WITH DIFFERENTIAL/PLATELET
BASOS ABS: 0 10*3/uL (ref 0.0–0.1)
BASOS PCT: 0 %
Eosinophils Absolute: 0 10*3/uL (ref 0.0–0.7)
Eosinophils Relative: 0 %
HEMATOCRIT: 38 % (ref 36.0–46.0)
HEMOGLOBIN: 12.5 g/dL (ref 12.0–15.0)
Lymphocytes Relative: 19 %
Lymphs Abs: 2 10*3/uL (ref 0.7–4.0)
MCH: 27.6 pg (ref 26.0–34.0)
MCHC: 32.9 g/dL (ref 30.0–36.0)
MCV: 83.9 fL (ref 78.0–100.0)
Monocytes Absolute: 0.7 10*3/uL (ref 0.1–1.0)
Monocytes Relative: 7 %
NEUTROS ABS: 7.9 10*3/uL — AB (ref 1.7–7.7)
NEUTROS PCT: 74 %
Platelets: 204 10*3/uL (ref 150–400)
RBC: 4.53 MIL/uL (ref 3.87–5.11)
RDW: 13.1 % (ref 11.5–15.5)
WBC: 10.6 10*3/uL — ABNORMAL HIGH (ref 4.0–10.5)

## 2016-12-05 MED ORDER — SIMETHICONE 40 MG/0.6ML PO SUSP
80.0000 mg | Freq: Four times a day (QID) | ORAL | Status: DC | PRN
  Administered 2016-12-05 – 2016-12-06 (×2): 80 mg via ORAL
  Filled 2016-12-05 (×3): qty 1.2

## 2016-12-05 MED ORDER — SIMETHICONE 40 MG/0.6ML PO SUSP
40.0000 mg | Freq: Four times a day (QID) | ORAL | Status: DC | PRN
  Administered 2016-12-05: 40 mg via ORAL
  Filled 2016-12-05: qty 0.6

## 2016-12-05 NOTE — Plan of Care (Signed)
Problem: Food- and Nutrition-Related Knowledge Deficit (NB-1.1) Goal: Nutrition education Formal process to instruct or train a patient/client in a skill or to impart knowledge to help patients/clients voluntarily manage or modify food choices and eating behavior to maintain or improve health. Outcome: Completed/Met Date Met: 12/05/16 Nutrition Education Note  Received consult for diet education per DROP protocol.   Discussed 2 week post op diet with pt. Emphasized that liquids must be non carbonated, non caffeinated, and sugar free. Fluid goals discussed. Pt to follow up with outpatient bariatric RD for further diet progression after 2 weeks. Multivitamins and minerals also reviewed. Teach back method used, pt expressed understanding, expect good compliance.   Diet: First 2 Weeks  You will see the nutritionist about two (2) weeks after your surgery. The nutritionist will increase the types of foods you can eat if you are handling liquids well:  If you have severe vomiting or nausea and cannot handle clear liquids lasting longer than 1 day, call your surgeon  Protein Shake  Drink at least 2 ounces of shake 5-6 times per day  Each serving of protein shakes (usually 8 - 12 ounces) should have a minimum of:  15 grams of protein  And no more than 5 grams of carbohydrate  Goal for protein each day:  Men = 80 grams per day  Women = 60 grams per day  Protein powder may be added to fluids such as non-fat milk or Lactaid milk or Soy milk (limit to 35 grams added protein powder per serving)   Hydration  Slowly increase the amount of water and other clear liquids as tolerated (See Acceptable Fluids)  Slowly increase the amount of protein shake as tolerated  Sip fluids slowly and throughout the day  May use sugar substitutes in small amounts (no more than 6 - 8 packets per day; i.e. Splenda)   Fluid Goal  The first goal is to drink at least 8 ounces of protein shake/drink per day (or as directed  by the nutritionist); some examples of protein shakes are Premier Protein, Johnson [employer] Johnson, AMR Corporation, EAS Edge HP, and Unjury. See handout from pre-op Bariatric Education Class:  Slowly increase the amount of protein shake you drink as tolerated  You may find it easier to slowly sip shakes throughout the day  It is important to get your proteins in first  Your fluid goal is to drink 64 - 100 ounces of fluid daily  It may take a few weeks to build up to this  32 oz (or more) should be clear liquids  And  32 oz (or more) should be full liquids (see below for examples)  Liquids should not contain sugar, caffeine, or carbonation   Clear Liquids:  Water or Sugar-free flavored water (i.e. Fruit H2O, Propel)  Decaffeinated coffee or tea (sugar-free)  Crystal Lite, Wyler's Lite, Minute Maid Lite  Sugar-free Jell-O  Bouillon or broth  Sugar-free Popsicle: *Less than 20 calories each; Limit 1 per day   Full Liquids:  Protein Shakes/Drinks + 2 choices per day of other full liquids  Full liquids must be:  No More Than 12 grams of Carbs per serving  No More Than 3 grams of Fat per serving  Strained low-fat cream soup  Non-Fat milk  Fat-free Lactaid Milk  Sugar-free yogurt (Dannon Lite & Fit, [place] yogurt, Oikos Zero)   Clayton Bibles, MS, RD, LDN Pager: [phone] After Hours Pager: [phone]

## 2016-12-05 NOTE — Progress Notes (Signed)
Patient have challenging time with proteins.  Ambulating frequently.  Continues to have gas pain.  Simethicone dose increased.

## 2016-12-05 NOTE — Consult Note (Signed)
   [facility] CM Inpatient Consult   12/05/2016  Tracy Brooks 1990/07/30 [id number]   Came back to room to speak [name] about [facility] Care Management/Link to [employer] program for [employer] employees/dependents with [employer]. 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  Denies any current Link to Wellness needs.  Provider Link to [employer], packet, contact information, and 24-hr nurse line magnet.   Discussed that she will receive a post hospital follow up call. Confirmed best contact number as [phone].  Appreciative of visit.    Raiford Noble, MSN-Ed, RN,BSN [facility] Liaison [phone]

## 2016-12-05 NOTE — Progress Notes (Signed)
Patient ID: Tracy Brooks, female   DOB: [birth year], 26 y.o.   MRN: [id number] [facility] Progress Note:   1 Day Post-Op  Subjective: Mental status is clear.  Sore and advancing slowly with the shakes Objective: Vital signs in last 24 hours: Temp:  [97.7 F (36.5 C)-98.7 F (37.1 C)] 98.7 F (37.1 C) (09/18 0801) Pulse Rate:  [62-78] 74 (09/18 0801) Resp:  [16-18] 18 (09/18 0801) BP: (110-139)/(53-81) 110/53 (09/18 0801) SpO2:  [96 %-100 %] 100 % (09/18 0801) Weight:  [130 kg (286 lb 9.6 oz)] 130 kg (286 lb 9.6 oz) (09/18 0657)  Intake/Output from previous day: 09/17 0701 - 09/18 0700 In: 3028.3 [P.O.:480; I.V.:2548.3] Out: 425 [Urine:400; Blood:25] Intake/Output this shift: Total I/O In: 460 [P.O.:60; I.V.:400] Out: -   Physical Exam: Work of breathing is normal.    Lab Results:  Results for orders placed or performed during the hospital encounter of 12/04/16 (from the past 48 hour(s))  Pregnancy, urine STAT morning of surgery     Status: None   Collection Time: 12/04/16  5:12 AM  Result Value Ref Range   Preg Test, Ur NEGATIVE NEGATIVE    Comment:        THE SENSITIVITY OF THIS METHODOLOGY IS >20 mIU/mL.   CBC     Status: Abnormal   Collection Time: 12/04/16  1:57 PM  Result Value Ref Range   WBC 12.8 (H) 4.0 - 10.5 K/uL   RBC 4.84 3.87 - 5.11 MIL/uL   Hemoglobin 13.5 12.0 - 15.0 g/dL   HCT 41.0 36.0 - 46.0 %   MCV 84.7 78.0 - 100.0 fL   MCH 27.9 26.0 - 34.0 pg   MCHC 32.9 30.0 - 36.0 g/dL   RDW 13.1 11.5 - 15.5 %   Platelets 194 150 - 400 K/uL  Creatinine, serum     Status: None   Collection Time: 12/04/16  1:57 PM  Result Value Ref Range   Creatinine, Ser 0.77 0.44 - 1.00 mg/dL   GFR calc non Af Amer >60 >60 mL/min   GFR calc Af Amer >60 >60 mL/min    Comment: (NOTE) The eGFR has been calculated using the CKD EPI equation. This calculation has not been validated in all clinical situations. eGFR's persistently <60 mL/min signify possible Chronic  Kidney Disease.   CBC WITH DIFFERENTIAL     Status: Abnormal   Collection Time: 12/05/16  5:44 AM  Result Value Ref Range   WBC 10.6 (H) 4.0 - 10.5 K/uL   RBC 4.53 3.87 - 5.11 MIL/uL   Hemoglobin 12.5 12.0 - 15.0 g/dL   HCT 38.0 36.0 - 46.0 %   MCV 83.9 78.0 - 100.0 fL   MCH 27.6 26.0 - 34.0 pg   MCHC 32.9 30.0 - 36.0 g/dL   RDW 13.1 11.5 - 15.5 %   Platelets 204 150 - 400 K/uL   Neutrophils Relative % 74 %   Neutro Abs 7.9 (H) 1.7 - 7.7 K/uL   Lymphocytes Relative 19 %   Lymphs Abs 2.0 0.7 - 4.0 K/uL   Monocytes Relative 7 %   Monocytes Absolute 0.7 0.1 - 1.0 K/uL   Eosinophils Relative 0 %   Eosinophils Absolute 0.0 0.0 - 0.7 K/uL   Basophils Relative 0 %   Basophils Absolute 0.0 0.0 - 0.1 K/uL    Radiology/Results: No results found.  Anti-infectives: Anti-infectives    Start     Dose/Rate Route Frequency Ordered Stop   12/04/16 0644  cefoTEtan  in Dextrose 5% (CEFOTAN) 2-2.08 GM-% IVPB    Comments:  Armistead, Lacey   : cabinet override      12/04/16 0644 12/04/16 1859   12/04/16 0511  cefoTEtan in Dextrose 5% (CEFOTAN) IVPB 2 g  Status:  Discontinued     2 g Intravenous On call to O.R. 12/04/16 0511 12/04/16 1309      Assessment/Plan: Problem List: Patient Active Problem List   Diagnosis Date Noted  . S/P laparoscopic sleeve gastrectomy Sept 2018 12/04/2016    Continuing observation.  May be discharged later today or in the AM depending on PO intake.   1 Day Post-Op    LOS: 1 day   Matt B. Martin, MD, FACS  [facility] [phone] beeper [phone]  12/05/2016 1:13 PM 

## 2016-12-05 NOTE — Progress Notes (Signed)
Patient alert and oriented, Post op day 1.  Provided support and encouragement.  Encouraged pulmonary toilet, ambulation and small sips of liquids. Completed 12 ounces of clear fluid.  Has begun protein shake this am.  Only complaint gas pain and upper abdominal pain.  Simethicone ordered, Tylenol and Roxicodone given to patient by bedside RN.  Reports  ambulated x 5 times overnight.  All questions answered.  Will continue to monitor.

## 2016-12-05 NOTE — Consult Note (Signed)
   [facility] CM Inpatient Consult   12/05/2016  Merlyn Bollen 10-22-1990 [id number]   Came to bedside to speak with Ms. Chestine Spore on behalf of [facility] Care Management/ Link to Wellness program for [facility] employees/dependents with [facility] insurance.   Unable to speak with Ms. Jaskiewicz at the time. Nursing at bedside.  Will come back at later time.    Raiford Noble, MSN-Ed, RN,BSN [facility] Liaison [phone]

## 2016-12-06 LAB — CBC WITH DIFFERENTIAL/PLATELET
BASOS PCT: 0 %
Basophils Absolute: 0 10*3/uL (ref 0.0–0.1)
EOS ABS: 0.1 10*3/uL (ref 0.0–0.7)
Eosinophils Relative: 1 %
HCT: 36.9 % (ref 36.0–46.0)
HEMOGLOBIN: 12.2 g/dL (ref 12.0–15.0)
Lymphocytes Relative: 22 %
Lymphs Abs: 1.9 10*3/uL (ref 0.7–4.0)
MCH: 27.8 pg (ref 26.0–34.0)
MCHC: 33.1 g/dL (ref 30.0–36.0)
MCV: 84.1 fL (ref 78.0–100.0)
MONOS PCT: 7 %
Monocytes Absolute: 0.6 10*3/uL (ref 0.1–1.0)
NEUTROS PCT: 70 %
Neutro Abs: 6 10*3/uL (ref 1.7–7.7)
Platelets: 179 10*3/uL (ref 150–400)
RBC: 4.39 MIL/uL (ref 3.87–5.11)
RDW: 12.9 % (ref 11.5–15.5)
WBC: 8.6 10*3/uL (ref 4.0–10.5)

## 2016-12-06 NOTE — Progress Notes (Signed)
Patient alert and oriented, pain is controlled. Patient is tolerating fluids, advanced to protein shake today, patient is tolerating well.  Reviewed Gastric sleeve discharge instructions with patient and patient is able to articulate understanding.  Provided information on BELT program, Support Group and [facility] outpatient pharmacy. All questions answered, will continue to monitor.  

## 2016-12-06 NOTE — Discharge Summary (Signed)
Physician Discharge Summary  Patient ID: Tracy Brooks MRN: [id number] DOB/AGE: 03-25-1990 26 y.o.  Admit date: 12/04/2016 Discharge date: 12/06/2016  Admission Diagnoses:  Morbid obesity  Discharge Diagnoses:  same  Principal Problem:   S/P laparoscopic sleeve gastrectomy Sept 2018   Surgery:  Sleeve gastrectomy  Discharged Condition: improved  Hospital Course:   Had surgery on Monday.  Still a bit nauseated and sore on Tuesday.  Not ready for discharge until Wed.  Condition improved  Consults: none  Significant Diagnostic Studies: none    Discharge Exam: Blood pressure (!) 117/54, pulse 62, temperature 98.8 F (37.1 C), temperature source Oral, resp. rate 18, height  (1.575 m), weight 126.6 kg (279 lb 3.2 oz), SpO2 100 %. Incisions OK  Disposition: 01-Home or Self Care  Discharge Instructions    AMB Referral to [facility] Care Management    Complete by:  As directed    Please assign UMR member for post discharge call. Currently at [facility]. Please call with questions. Thanks. Raiford Noble, MSN-Ed, RN,BSN-THN Care [facility] [phone]   Reason for consult:  Please assign UMR member for post discharge call   Expected date of contact:  1-3 days (reserved for hospital discharges)   Ambulate hourly while awake    Complete by:  As directed    Call MD for:  difficulty breathing, headache or visual disturbances    Complete by:  As directed    Call MD for:  persistant dizziness or light-headedness    Complete by:  As directed    Call MD for:  persistant nausea and vomiting    Complete by:  As directed    Call MD for:  redness, tenderness, or signs of infection (pain, swelling, redness, odor or green/yellow discharge around incision site)    Complete by:  As directed    Call MD for:  severe uncontrolled pain    Complete by:  As directed    Call MD for:  temperature >101 F    Complete by:  As directed    Diet bariatric full liquid    Complete by:  As  directed    Incentive spirometry    Complete by:  As directed    Perform hourly while awake     Allergies as of 12/06/2016      Reactions   Metronidazole Nausea And Vomiting   Tape Itching, Rash      Medication List    TAKE these medications   multivitamin with minerals Tabs tablet Take 1 tablet by mouth daily.   SKYLA 13.5 MG Iud Generic drug:  Levonorgestrel by Intrauterine route.   VITAMIN D PO Take 1 capsule by mouth daily.            Discharge Care Instructions        Start     Ordered   12/06/16 0000  Diet bariatric full liquid     12/06/16 1153   12/06/16 0000  Ambulate hourly while awake     12/06/16 1153   12/06/16 0000  Incentive spirometry    Comments:  Perform hourly while awake   12/06/16 1153   12/06/16 0000  Call MD for:  temperature >101 F     12/06/16 1153   12/06/16 0000  Call MD for:  persistant nausea and vomiting     12/06/16 1153   12/06/16 0000  Call MD for:  severe uncontrolled pain     12/06/16 1153   12/06/16 0000  Call MD for:  redness, tenderness, or signs of infection (pain, swelling, redness, odor or green/yellow discharge around incision site)     12/06/16 1153   12/06/16 0000  Call MD for:  difficulty breathing, headache or visual disturbances     12/06/16 1153   12/06/16 0000  Call MD for:  persistant dizziness or light-headedness     12/06/16 1153   12/05/16 0000  AMB Referral to [facility] Care Management    Comments:  Please assign UMR member for post discharge call. Currently at [facility] Please call with questions. Thanks. Raiford Noble, MSN-Ed, [facility] Liaison-[phone]  Question Answer Comment  Reason for consult Please assign UMR member for post discharge call   Expected date of contact 1-3 days (reserved for hospital discharges)      12/05/16 1111     Follow-up Information    Luretha Murphy. Go on 12/20/2016.   Why:  at 2 pm in [place], Molli Hazard, MD Follow up.    Specialty:  General Surgery Contact information: [address] [place] [address] [phone]           Signed: Valarie Merino 12/06/2016, 11:53 AM

## 2016-12-06 NOTE — Progress Notes (Signed)
Patient alert and oriented, Post op day 2.  Provided support and encouragement.  Encouraged pulmonary toilet, ambulation and small sips of liquids. 3 ounces of protein at this time no complaints of pain this am.  All questions answered.  Will continue to monitor.

## 2016-12-06 NOTE — Progress Notes (Signed)
Patient ID: Carolan Clines, female   DOB: Oct 03, 1990, 26 y.o.   MRN: [id number] [facility] Progress Note:   2 Days Post-Op  Subjective: Mental status is clear Objective: Vital signs in last 24 hours: Temp:  [98.6 F (37 C)-98.8 F (37.1 C)] 98.6 F (37 C) (09/19 0526) Pulse Rate:  [66-76] 71 (09/19 0526) Resp:  [18] 18 (09/19 0526) BP: (124-133)/(59-84) 130/61 (09/19 0526) SpO2:  [99 %-100 %] 100 % (09/19 0526) Weight:  [126.6 kg (279 lb 3.2 oz)] 126.6 kg (279 lb 3.2 oz) (09/19 0526)  Intake/Output from previous day: 09/18 0701 - 09/19 0700 In: 2195 [P.O.:195; I.V.:2000] Out: 1000 [Urine:1000] Intake/Output this shift: Total I/O In: -  Out: 400 [Urine:400]  Physical Exam: Work of breathing is not labored.  Taking liquids better.  Ready for discharge later today.    Lab Results:  Results for orders placed or performed during the hospital encounter of 12/04/16 (from the past 48 hour(s))  CBC     Status: Abnormal   Collection Time: 12/04/16  1:57 PM  Result Value Ref Range   WBC 12.8 (H) 4.0 - 10.5 K/uL   RBC 4.84 3.87 - 5.11 MIL/uL   Hemoglobin 13.5 12.0 - 15.0 g/dL   HCT 41.0 36.0 - 46.0 %   MCV 84.7 78.0 - 100.0 fL   MCH 27.9 26.0 - 34.0 pg   MCHC 32.9 30.0 - 36.0 g/dL   RDW 13.1 11.5 - 15.5 %   Platelets 194 150 - 400 K/uL  Creatinine, serum     Status: None   Collection Time: 12/04/16  1:57 PM  Result Value Ref Range   Creatinine, Ser 0.77 0.44 - 1.00 mg/dL   GFR calc non Af Amer >60 >60 mL/min   GFR calc Af Amer >60 >60 mL/min    Comment: (NOTE) The eGFR has been calculated using the CKD EPI equation. This calculation has not been validated in all clinical situations. eGFR's persistently <60 mL/min signify possible Chronic Kidney Disease.   CBC WITH DIFFERENTIAL     Status: Abnormal   Collection Time: 12/05/16  5:44 AM  Result Value Ref Range   WBC 10.6 (H) 4.0 - 10.5 K/uL   RBC 4.53 3.87 - 5.11 MIL/uL   Hemoglobin 12.5 12.0 - 15.0 g/dL   HCT  38.0 36.0 - 46.0 %   MCV 83.9 78.0 - 100.0 fL   MCH 27.6 26.0 - 34.0 pg   MCHC 32.9 30.0 - 36.0 g/dL   RDW 13.1 11.5 - 15.5 %   Platelets 204 150 - 400 K/uL   Neutrophils Relative % 74 %   Neutro Abs 7.9 (H) 1.7 - 7.7 K/uL   Lymphocytes Relative 19 %   Lymphs Abs 2.0 0.7 - 4.0 K/uL   Monocytes Relative 7 %   Monocytes Absolute 0.7 0.1 - 1.0 K/uL   Eosinophils Relative 0 %   Eosinophils Absolute 0.0 0.0 - 0.7 K/uL   Basophils Relative 0 %   Basophils Absolute 0.0 0.0 - 0.1 K/uL    Radiology/Results: No results found.  Anti-infectives: Anti-infectives    Start     Dose/Rate Route Frequency Ordered Stop   12/04/16 0644  cefoTEtan in Dextrose 5% (CEFOTAN) 2-2.08 GM-% IVPB    Comments:  Carleene Cooper   : cabinet override      12/04/16 0644 12/04/16 1859   12/04/16 0511  cefoTEtan in Dextrose 5% (CEFOTAN) IVPB 2 g  Status:  Discontinued     2 g Intravenous  On call to O.R. 12/04/16 0511 12/04/16 1309      Assessment/Plan: Problem List: Patient Active Problem List   Diagnosis Date Noted  . S/P laparoscopic sleeve gastrectomy Sept 2018 12/04/2016    Anticipate discharge later today 2 Days Post-Op    LOS: 2 days   Matt B. Martin, MD, FACS  [facility] [phone] beeper [phone]  12/06/2016 8:32 AM 

## 2016-12-06 NOTE — Progress Notes (Signed)
Pt alert, oriented, ambulating, and tolerating diet.  No complaints of pain, only gas. D/C instructions were given, all questions answered. D/C'd home.

## 2016-12-07 ENCOUNTER — Other Ambulatory Visit: Payer: Self-pay | Admitting: *Deleted

## 2016-12-07 ENCOUNTER — Encounter: Payer: Self-pay | Admitting: *Deleted

## 2016-12-07 NOTE — Patient Outreach (Signed)
[facility]) Care Management  12/07/2016  Tracy Brooks 04-01-1990 [id number]  Subjective: Telephone call to patient's home / mobile number, spoke with patient, and HIPAA verified.  Discussed [facility] Care Management UMR Transition of care follow up, patient voiced understanding, and is in agreement to follow up.   Patient states she is doing alright, having a lot of gas pain, feeling full, and trying her best to keep up with the fluid intake requirements. States she has a follow up appointment with surgeon on 12/20/16 and nutritionist on 12/19/16. Patient voices understanding of medical diagnosis, surgery,  and treatment plan.  [facility] benefits discussed on 11/30/16 preoperative call and patient states no additional questions at this time. Patient states she does not have any education material, transition of care, care coordination, disease management, disease monitoring, transportation, community resource, or pharmacy needs at this time. States she is very appreciative of the follow up and is in agreement to receive [facility] Care Management information.    Objective: Per chart review, patient hospitalized  12/04/16 - 12/06/16 for morbid obesity at [facility].   Status post LAPAROSCOPIC GASTRIC SLEEVE RESECTION WITH UPPER ENDO on 12/04/16.     Assessment: Received UMR Preoperative Call and Transition of care referral on 11/22/16.  Preoperative call completed, and transition of care follow up completed, and will proceed with case closure.    Plan: RNCM will send patient successful outreach letter, [facility] pamphlet, and magnet. RNCM will send case closure due to follow up completed / no care management needs request to Iverson Alamin at [facility] Care Management.    Karime Scheuermann H. Gardiner Barefoot, BSN, CCM [facility] Care Management [facility] Telephonic CM Phone: [phone] Fax: [phone]

## 2016-12-11 ENCOUNTER — Telehealth (HOSPITAL_COMMUNITY): Payer: Self-pay

## 2016-12-11 NOTE — Telephone Encounter (Signed)
Made discharge phone call to patient. Asking the following questions.    1. Do you have someone to care for you now that you are home?  Mom is at home with patient 2. Are you having pain now that is not relieved by your pain medication?  Taking pain medication at night only 3. Are you able to drink the recommended daily amount of fluids (48 ounces minimum/day) and protein (60-80 grams/day) as prescribed by the dietitian or nutritional counselor?  48 ounces of total fluid, 45 grams of protein 4. Are you taking the vitamins and minerals as prescribed?  Yes, but does not like the multivitamin, says calcium is very sweet, discussed different brands 5. Do you have the "on call" number to contact your surgeon if you have a problem or question?  yes 6. Are your incisions free of redness, swelling or drainage? (If steri strips, address that these can fall off, shower as tolerated) dermabond is coming off, incisions are all closed 7. Have your bowels moved since your surgery?  If not, are you passing gas?  Yes had to use miralax, now bm everyday 8. Are you up and walking 3-4 times per day?yes   9. Were you provided your discharge medications before your surgery or before you were discharged from the hospital and are you taking them without problem?  yes

## 2016-12-14 ENCOUNTER — Telehealth (HOSPITAL_COMMUNITY): Payer: Self-pay

## 2016-12-14 ENCOUNTER — Telehealth: Payer: Self-pay | Admitting: Skilled Nursing Facility1

## 2016-12-19 ENCOUNTER — Encounter: Payer: 59 | Attending: Surgery | Admitting: Skilled Nursing Facility1

## 2016-12-19 DIAGNOSIS — E669 Obesity, unspecified: Secondary | ICD-10-CM | POA: Diagnosis not present

## 2016-12-19 NOTE — Telephone Encounter (Signed)
returned call, patient wanted to know if she could try Flintstone vitamins and biotin.  referred to list given at discharge for vitamins

## 2016-12-20 ENCOUNTER — Telehealth: Payer: Self-pay | Admitting: Skilled Nursing Facility1

## 2016-12-20 ENCOUNTER — Encounter: Payer: Self-pay | Admitting: Skilled Nursing Facility1

## 2016-12-20 NOTE — Progress Notes (Signed)
Bariatric Class:  Appt start time: 1530 end time:  1630.  2 Week Post-Operative Nutrition Class  Patient was seen on 12/19/2016 for [facility]ative [facility]   Surgery date: 12/04/2016 Surgery type: Sleeve Start weight at [facility]: 293.3 Weight today: 291.6  TANITA  BODY COMP RESULTS  12/20/2016   BMI (kg/m^2) 48.1   Fat Mass (lbs) 140.4   Fat Free Mass (lbs) 127   Total Body Water (lbs) 94.8   The following the learning objectives were met by the patient during this course:  Identifies Phase 3A (Soft, High Proteins) Dietary Goals and will begin from 2 weeks post-operatively to 2 months post-operatively  Identifies appropriate sources of fluids and proteins   States protein recommendations and appropriate sources post-operatively  Identifies the need for appropriate texture modifications, mastication, and bite sizes when consuming solids  Identifies appropriate multivitamin and calcium sources post-operatively  Describes the need for physical activity post-operatively and will follow MD recommendations  States when to call healthcare provider regarding medication questions or post-operative complications  Handouts given during class include:  Phase 3A: Soft, High Protein Diet Handout  Follow-Up Plan: Patient will follow-up at [facility] in 6 weeks for 2 month post-op nutrition visit for diet advancement per MD.

## 2016-12-20 NOTE — Telephone Encounter (Signed)
They have a significant amount of fat. I would not recommend them.   From: [email or web address]  Sent: Tuesday, December 19, 2016 6:02 PM To: Kadesia [email or web address]> Subject: Re: [External Email]Questions.  *Caution - External Email* I totally forgot to ask you at the class today. But are these allowed?      Sent from my iPhone  On Dec 14, 2016, at 3:56 PM, Sanyiah [email or web address]> wrote:

## 2017-01-31 ENCOUNTER — Encounter: Payer: 59 | Attending: Surgery | Admitting: Registered"

## 2017-01-31 ENCOUNTER — Encounter: Payer: Self-pay | Admitting: Registered"

## 2017-01-31 DIAGNOSIS — E669 Obesity, unspecified: Secondary | ICD-10-CM | POA: Insufficient documentation

## 2017-01-31 NOTE — Patient Instructions (Signed)
Goals:  Follow Phase 3B: High Protein + Non-Starchy Vegetables  Eat 3-6 small meals/snacks, every 3-5 hrs  Increase lean protein foods to meet 60g goal  Increase fluid intake to 64oz +  Avoid drinking 15 minutes before, during and 30 minutes after eating  Aim for >30 min of physical activity daily  - Aim to be more intentional about drinking at least 64 ounces of fluid a day; get a 64-oz container to help.  - Aim to do 10 min of physical activity with stairs after taking younger brother to school.

## 2017-01-31 NOTE — Progress Notes (Signed)
Follow-up visit:  8 Weeks Post-Operative Sleeve Gastrectomy Surgery  Medical Nutrition Therapy:  Appt start time: 9:15 end time:  10:15.  Primary concerns today: Post-operative Bariatric Surgery Nutrition Management.  Non scale victories: wearing smaller shirt sizes (from 3XL to XL), can fit jeans that haven't been worn in 1.5 yrs, seat belt is not as tight, 3-mi walk and didn't feel winded, felt comfortable in chair while getting pedicure  Surgery date: 12/04/2016 Surgery type: Sleeve gastrectomy [facility]: 293.3 Weight today: 252.2  Weight change: 15.1 lbs from 267.3 on 12/20/2016 Total weight lost: 41.1 lbs Weight loss goal: not stated    TANITA  BODY COMP RESULTS  12/20/2016 01/31/2017   BMI (kg/m^2) 48.1 46.1   Fat Mass (lbs) 140.4 128.6   Fat Free Mass (lbs) 127 123.6   Total Body Water (lbs) 94.8 91.6    Pt states she eats almond butter, rice cakes, crackers; having cravings for rice. Pt states she has had a cajun filet biscuit from [employer]. Pt states she has advanced herself and monitors to make sure fat and sugar are in single digits on nutrition facts label. Pt states she is still waiting to have fruit. Pt states she is afraid of getting sick and does not want to try anything that we have not recommended. Pt states water does not work well for her; does not like artifical sweeteners with water...too sweet. Pt states she is consuming about 40-50 ounces of fluid a day on a good day. Pt states she ehlps take care of her 558-year old autistic younger brother. Pt states she is still trying to get motivated to begin an exercise routine. Pt states she has started doing better with consistently taking MVI and calcium supplements daily.    Preferred Learning Style:   No preference indicated   Learning Readiness:   Ready  Change in progress  24-hr recall: B (AM): egg (6g), 2 strips [place] bacon (12g) Snk (AM): greek yogurt (12g)  L (PM): kale and spinach salad, 2.5  oz chicken (17.5g), low-fat sour cream with ranch packet; sometimes with avocado in dressing Snk (PM): none  D (PM): ham soup (14g) with celery and carrots, cheese crackers (6g) Snk (PM): none  Fluid intake: decaf coffee (16 oz), FairLife skim, unsweetened decaf tea w/ artifical sweetener; 40-50 oz Estimated total protein intake: averages 55-60g  Medications: See list Supplementation: ProCare MVI chewable + 3 TUMS  Using straws: no Drinking while eating: no Having you been chewing well:yes Chewing/swallowing difficulties: no Changes in vision: no Changes to mood/headaches: no Hair loss/Changes to skin/Changes to nails: some hair breakage, no, no Any difficulty focusing or concentrating: no Sweating: no Dizziness/Lightheaded: no Palpitations: no  Carbonated beverages: no N/V/D/C/GAS: no, no, no, no, no Abdominal Pain: no Dumping syndrome: no Last Lap-Band fill: N/A  Recent physical activity:  ADLs  Progress Towards Goal(s):  In progress.  Handouts given during visit include:  Phase 3B: High protein + NS vegetables   Nutritional Diagnosis:  Inadequate fluid intake As related to bariatric post-op recommendations.  As evidenced by pt report of less than 64 fluid ounces a day.    Intervention:  Nutrition education and counseling. Goals:  Follow Phase 3B: High Protein + Non-Starchy Vegetables  Eat 3-6 small meals/snacks, every 3-5 hrs  Increase lean protein foods to meet 60g goal  Increase fluid intake to 64oz +  Avoid drinking 15 minutes before, during and 30 minutes after eating  Aim for >30 min of physical activity  daily - Aim to be more intentional about drinking at least 64 ounces of fluid a day; get a 64-oz container to help. - Aim to do 10 min of physical activity with stairs after taking younger brother to school.   Teaching Method Utilized:  Visual Auditory Hands on  Barriers to learning/adherence to lifestyle change: home-life  environment  Demonstrated degree of understanding via:  Teach Back   Monitoring/Evaluation:  Dietary intake, exercise, lap band fills, and body weight. Follow up in 3 months for 5 month post-op visit.

## 2017-03-08 DIAGNOSIS — M62830 Muscle spasm of back: Secondary | ICD-10-CM | POA: Diagnosis not present

## 2017-03-08 MED FILL — tiZANidine HCL 4 MG TABS: 4 | 8 days supply | Qty: 30 | Fill #0

## 2017-04-20 DIAGNOSIS — H52203 Unspecified astigmatism, bilateral: Secondary | ICD-10-CM | POA: Diagnosis not present

## 2017-04-20 DIAGNOSIS — H5213 Myopia, bilateral: Secondary | ICD-10-CM | POA: Diagnosis not present

## 2017-04-27 DIAGNOSIS — K5900 Constipation, unspecified: Secondary | ICD-10-CM | POA: Diagnosis not present

## 2017-04-27 DIAGNOSIS — Z9884 Bariatric surgery status: Secondary | ICD-10-CM | POA: Diagnosis not present

## 2017-04-27 DIAGNOSIS — J069 Acute upper respiratory infection, unspecified: Secondary | ICD-10-CM | POA: Diagnosis not present

## 2017-05-01 ENCOUNTER — Encounter: Payer: Self-pay | Admitting: Registered"

## 2017-05-01 ENCOUNTER — Encounter: Payer: 59 | Attending: Surgery | Admitting: Registered"

## 2017-05-01 DIAGNOSIS — E669 Obesity, unspecified: Secondary | ICD-10-CM | POA: Insufficient documentation

## 2017-05-01 NOTE — Progress Notes (Signed)
Follow-up visit: 5 Months Post-Operative Sleeve Gastrectomy Surgery  Medical Nutrition Therapy:  Appt start time: 9:30 end time: 10:15.  Primary concerns today: Post-operative Bariatric Surgery Nutrition Management.  Non scale victories: wearing smaller shirt sizes (from 3XL to XL), can fit jeans that haven't been worn in 1.5 yrs, seat belt is not as tight, 3-mi walk and didn't feel winded, felt comfortable in chair while getting pedicure  Surgery date: 12/04/2016 Surgery type: Sleeve gastrectomy Start weight at [facility]: 293.3 Weight today: 228.6 Weight change: 23.6 lbs from 252.2 on 01/31/2017 Total weight lost: 64.7 lbs Weight loss goal: none stated   TANITA  BODY COMP RESULTS  12/20/2016 01/31/2017 05/01/2017   BMI (kg/m^2) 48.1 46.1 41.1   Fat Mass (lbs) 140.4 128.6 110.0   Fat Free Mass (lbs) 127 123.6 118.6   Total Body Water (lbs) 94.8 91.6 87.2    Pt states she has bowel movement 1-2 times/week. Pt states she is struggling with eating sometimes; can vary eating 2-4 times a day. Pt states life is hectic right now and she forgets to track protein intake and when she does want to track, forgets the amounts that she has eaten. Pt states she has started eating blueberries to help with having bowel movements; reports also trying strawberry and apple. Pt states she pork chops/loins hurt her stomach; loves chicken. Pt states textures have changed for her even touching certain things like velvet. Pt states she does not like shrimp anymore. Pt states she is afraid she is going to fail. Pt has increased fluid intake and drinking at least 64 ounces on most days. Pt states she needs energy drinks and coffee to help her get through her day. Pt states she does not like the taste of chewable multivitamins anymore. Pt states she is moving to [place], [place] in July, will drive back here for appointments.   Pt states she eats almond butter, rice cakes, crackers; having cravings for rice. Pt states she  has had a cajun filet biscuit from [employer]. Pt states she has advanced herself and monitors to make sure fat and sugar are in single digits on nutrition facts label. Pt states she is still waiting to have fruit. Pt states she is afraid of getting sick and does not want to try anything that we have not recommended. Pt states water does not work well for her; does not like artifical sweeteners with water...too sweet. Pt states she ehlps take care of her 34-year old autistic younger brother. Pt states she is still trying to get motivated to begin an exercise routine. Pt states she has started doing better with consistently taking MVI and calcium supplements daily.    Preferred Learning Style:   No preference indicated   Learning Readiness:   Ready  Change in progress  24-hr recall: B (7 AM): egg (6g), 1 strip bacon (6g) Snk (AM): 1/2 bacon, cheese, egg omelette (9.5g) or greek yogurt (12g)  L (PM): 1-2 oz chicken tender (7-14g), hush puppy  Snk (PM): none  D (PM): chicken, cream cheese (21g), broccoli bake, asparagus Snk (PM): pretzel sticks, almond butter (3g)  Fluid intake: water (48 oz), energy drink (16 oz), coffee (16 oz), Fair-Life skim, unsweetened decaf tea w/ artifical sweetener (12 oz); 64+ oz Estimated total protein intake: averages 50-60g  Medications: See list Supplementation: 2 Equate MVI tablet + 3 calcium + hair, skin, nails   Using straws: sometimes Drinking while eating: only when having hiccups while eating Having you been chewing well: yes  Chewing/swallowing difficulties: no Changes in vision: no Changes to mood/headaches: no Hair loss/Changes to skin/Changes to nails: some hair breakage, no, no Any difficulty focusing or concentrating: no Sweating: no Dizziness/Lightheaded: sometimes when bending over and going from bending to standing Palpitations: no  Carbonated beverages: yes, energy drinks N/V/D/C/GAS: no, no, no, sometimes, no Abdominal Pain:  no Dumping syndrome: no Last Lap-Band fill: N/A  Recent physical activity:  ADL's, taking stairs at school  Progress Towards Goal(s):  In progress.  Handouts given during visit include:  Snack Ideas  Mental Health Providers   Nutritional Diagnosis:  [place]-3.3 Overweight/obesity related to past poor dietary habits and physical inactivity as evidenced by patient w/ recent sleeve gastrectomy surgery following dietary guidelines for continued weight loss.     Intervention:  Nutrition education and counseling. Goals: - Try ProCare Health capsule one-a-day multivitamin.  - Aim to track protein intake.  - Reach out to mental health professional.  - Aim for at least 30 min physical activity at least 5 days/week.   Teaching Method Utilized:  Visual Auditory Hands on  Barriers to learning/adherence to lifestyle change: home-life environment  Demonstrated degree of understanding via:  Teach Back   Monitoring/Evaluation:  Dietary intake, exercise, lap band fills, and body weight. Follow up in 3 months for 8 month post-op visit.

## 2017-05-01 NOTE — Patient Instructions (Addendum)
-   Try ProCare Health capsule one-a-day multivitamin.   - Aim to track protein intake.   - Reach out to mental health professional.   - Aim for at least 30 min physical activity at least 5 days/week.

## 2017-05-11 DIAGNOSIS — Z9884 Bariatric surgery status: Secondary | ICD-10-CM | POA: Diagnosis not present

## 2017-05-21 DIAGNOSIS — Z9884 Bariatric surgery status: Secondary | ICD-10-CM | POA: Diagnosis not present

## 2017-06-08 DIAGNOSIS — Z23 Encounter for immunization: Secondary | ICD-10-CM | POA: Diagnosis not present

## 2017-06-08 DIAGNOSIS — Z0000 Encounter for general adult medical examination without abnormal findings: Secondary | ICD-10-CM | POA: Diagnosis not present

## 2017-06-11 DIAGNOSIS — R1032 Left lower quadrant pain: Secondary | ICD-10-CM | POA: Diagnosis not present

## 2017-06-11 DIAGNOSIS — K219 Gastro-esophageal reflux disease without esophagitis: Secondary | ICD-10-CM | POA: Diagnosis not present

## 2017-06-11 DIAGNOSIS — Z9884 Bariatric surgery status: Secondary | ICD-10-CM | POA: Diagnosis not present

## 2017-06-11 DIAGNOSIS — R1031 Right lower quadrant pain: Secondary | ICD-10-CM | POA: Diagnosis not present

## 2017-06-11 DIAGNOSIS — Z0000 Encounter for general adult medical examination without abnormal findings: Secondary | ICD-10-CM | POA: Diagnosis not present

## 2017-06-13 DIAGNOSIS — F419 Anxiety disorder, unspecified: Secondary | ICD-10-CM | POA: Diagnosis not present

## 2017-06-13 DIAGNOSIS — F329 Major depressive disorder, single episode, unspecified: Secondary | ICD-10-CM | POA: Diagnosis not present

## 2017-06-13 MED FILL — busPIRone HCL 5 MG TABS: 5 | 30 days supply | Qty: 90 | Fill #0

## 2017-07-18 ENCOUNTER — Ambulatory Visit: Payer: Self-pay | Admitting: Registered"

## 2017-07-30 DIAGNOSIS — B9689 Other specified bacterial agents as the cause of diseases classified elsewhere: Secondary | ICD-10-CM | POA: Diagnosis not present

## 2017-07-30 DIAGNOSIS — N760 Acute vaginitis: Secondary | ICD-10-CM | POA: Diagnosis not present

## 2017-07-30 DIAGNOSIS — Z7251 High risk heterosexual behavior: Secondary | ICD-10-CM | POA: Diagnosis not present

## 2017-07-30 DIAGNOSIS — J0140 Acute pansinusitis, unspecified: Secondary | ICD-10-CM | POA: Diagnosis not present

## 2017-07-31 DIAGNOSIS — Z7251 High risk heterosexual behavior: Secondary | ICD-10-CM | POA: Diagnosis not present

## 2017-07-31 MED FILL — BENZONATATE 100 MG CAPSULE: 100 | 7 days supply | Qty: 20 | Fill #0

## 2017-07-31 MED FILL — CLINDAMYCIN HCL 300 MG CAP: 300 | 7 days supply | Qty: 14 | Fill #0

## 2017-08-05 DIAGNOSIS — R062 Wheezing: Secondary | ICD-10-CM | POA: Diagnosis not present

## 2017-08-05 DIAGNOSIS — R05 Cough: Secondary | ICD-10-CM | POA: Diagnosis not present

## 2017-09-03 ENCOUNTER — Ambulatory Visit (HOSPITAL_COMMUNITY): Payer: 59 | Admitting: Licensed Clinical Social Worker

## 2017-09-05 ENCOUNTER — Encounter: Payer: Self-pay | Admitting: Registered"

## 2017-09-05 ENCOUNTER — Encounter: Payer: 59 | Attending: Surgery | Admitting: Registered"

## 2017-09-05 DIAGNOSIS — Z9884 Bariatric surgery status: Secondary | ICD-10-CM | POA: Diagnosis not present

## 2017-09-05 DIAGNOSIS — Z713 Dietary counseling and surveillance: Secondary | ICD-10-CM | POA: Insufficient documentation

## 2017-09-05 DIAGNOSIS — E669 Obesity, unspecified: Secondary | ICD-10-CM

## 2017-09-05 NOTE — Progress Notes (Signed)
Follow-up visit: 9 Months Post-Operative Sleeve Gastrectomy Surgery  Medical Nutrition Therapy:  Appt start time: 9:00 end time: 9:45  Primary concerns today: Post-operative Bariatric Surgery Nutrition Management.  Non scale victories: wearing smaller shirt sizes (from 3XL to L), can fit jeans that haven't been worn in 1.5 yrs, seat belt is not as tight, 3-mi walk and didn't feel winded, felt comfortable in chair while getting pedicure, running up to 2 minutes without stopping, from size 26 to 16, gaining confidence   Surgery date: 12/04/2016 Surgery type: Sleeve gastrectomy [facility]: 293.3 Weight today: 208.2 Weight change: 20.4 lbs from 228.6 on 05/01/2017 Total weight lost: 85.1 lbs Weight loss goal: less than 200 lbs, size 10  TANITA  BODY COMP RESULTS  12/20/2016 01/31/2017 05/01/2017 09/05/2017   BMI (kg/m^2) 48.1 46.1 41.1 37.5   Fat Mass (lbs) 140.4 128.6 110.0 91.0   Fat Free Mass (lbs) 127 123.6 118.6 117.2   Total Body Water (lbs) 94.8 91.6 87.2 85.4    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 Pt states she is proud of herself. Pt states she wants to be at whatever weight/size her body stops at. Pt states she is tracking protein using [employer]itness[employer]. Pt states she consumes ~1200 calories/day; eating low calorie amd low carbohydrate. Pt states she does not tolerate pork well other than bacon. Pt states she has found her love again for exercise. Pt states she is not doing well with taking calcium daily.   Pt states she has bowel movement 1-2 times/week. Pt states she has started eating blueberries to help with having bowel movements; reports also trying strawberry and apple. Pt states she pork chops/loins hurt her stomach; loves chicken. Pt states she is afraid she is going to fail. Pt has increased fluid intake and drinking at least 64 ounces on most  days. Pt states she needs energy drinks and coffee to help her get through her day. Pt states she does not like the taste of chewable multivitamins anymore.   Pt states she helps take care of her 472-year old autistic younger brother.   Preferred Learning Style:   No preference indicated   Learning Readiness:   Ready  Change in progress  24-hr recall: B (7 AM): egg (6g), 1 strip bacon (6g), olive oil  Snk (AM): string cheese (6g) L (PM): 1 cup iceberg lettuce, 1 oz chicken breast (7g), 1 Tbs ranch dressing or 2 oz beef patty (14g), salad Snk (PM): 1/2 oz almonds (4g), 5 oz watermelon D (PM): 2 oz chicken (14g), cheese (7g), roasted eggplant, roasted zucchini, celery, almond butter (3g)  Snk (PM): 1 oz cheddar or popcorn, pretzel sticks, almond butter (3g)  Fluid intake: water (48 oz), energy drink (16 oz),  unsweetened decaf tea w/ artifical sweetener (12 oz); 64+ oz Estimated total protein intake: averages 60+ grams  Medications: See list Supplementation: Bariatric Advantage multivitamin capsules  Using straws: sometimes Drinking while eating: sometimes Having you been chewing well: yes Chewing/swallowing difficulties: no Changes in vision: no Changes to mood/headaches: no Hair loss/Changes to skin/Changes to nails: some hair breakage, no, sometimes weak Any difficulty focusing or concentrating: no Sweating: no Dizziness/Lightheaded: sometimes when bending over and going from bending to standing Palpitations: no  Carbonated beverages: yes, energy drinks N/V/D/C/GAS: no, no, no, no, no Abdominal Pain: no Dumping syndrome: no Last Lap-Band fill: N/A  Recent physical activity: weight-bearing exercises, intervals  with running/walking, strength training 45 min, 6 days/week    Progress Towards Goal(s):  In progress.  Handouts given during visit include:  Phase V: Protein + All vegetables   Nutritional Diagnosis:  [place]-3.3 Overweight/obesity related to past poor dietary  habits and physical inactivity as evidenced by patient w/ recent sleeve gastrectomy surgery following dietary guidelines for continued weight loss.     Intervention:  Nutrition education and counseling. Pt was educated and counseled on how to re-introduce starchy vegetables into her eating regimen. Pt was in agreement with goals stated.  Goals:  - Take multivitamins and calcium supplements at least 2 hours a day.  - Try caramel calcium supplements.  - Eat protein first, non-starchy vegetables next, then carbohydrates.  - Keep up the great work!  Teaching Method Utilized:  Visual Auditory Hands on  Barriers to learning/adherence to lifestyle change: none identified  Demonstrated degree of understanding via:  Teach Back   Monitoring/Evaluation:  Dietary intake, exercise, lap band fills, and body weight. Follow up in 3 months for 9 month post-op visit.

## 2017-09-05 NOTE — Patient Instructions (Addendum)
-   Take multivitamins and calcium supplements at least 2 hours a day.   - Try caramel calcium supplements.   - Eat protein first, non-starchy vegetables next, then carbohydrates.   - Keep up the great work!

## 2017-09-12 DIAGNOSIS — N926 Irregular menstruation, unspecified: Secondary | ICD-10-CM | POA: Diagnosis not present

## 2017-09-12 DIAGNOSIS — Z30431 Encounter for routine checking of intrauterine contraceptive device: Secondary | ICD-10-CM | POA: Diagnosis not present

## 2017-09-12 DIAGNOSIS — E6609 Other obesity due to excess calories: Secondary | ICD-10-CM | POA: Diagnosis not present

## 2017-09-12 DIAGNOSIS — R1032 Left lower quadrant pain: Secondary | ICD-10-CM | POA: Diagnosis not present

## 2017-09-12 DIAGNOSIS — Z6838 Body mass index (BMI) 38.0-38.9, adult: Secondary | ICD-10-CM | POA: Diagnosis not present

## 2017-09-24 MED FILL — miSOPROStol 200 MCG TABS: 200 | 2 days supply | Qty: 4 | Fill #0

## 2017-10-17 DIAGNOSIS — Z30433 Encounter for removal and reinsertion of intrauterine contraceptive device: Secondary | ICD-10-CM | POA: Diagnosis not present

## 2017-10-17 MED FILL — DOXYCYCLINE HYCLATE 100 MG: 100 | 7 days supply | Qty: 14 | Fill #0

## 2017-10-26 DIAGNOSIS — Z09 Encounter for follow-up examination after completed treatment for conditions other than malignant neoplasm: Secondary | ICD-10-CM | POA: Diagnosis not present

## 2017-10-26 DIAGNOSIS — K219 Gastro-esophageal reflux disease without esophagitis: Secondary | ICD-10-CM | POA: Diagnosis not present

## 2017-10-26 MED FILL — PANTOPRAZOLE SOD DR 40 MG T: 40 | 30 days supply | Qty: 30 | Fill #0

## 2017-10-30 DIAGNOSIS — M5442 Lumbago with sciatica, left side: Secondary | ICD-10-CM | POA: Diagnosis not present

## 2017-10-30 MED FILL — predniSONE 10 MG TABS: 10 | 6 days supply | Qty: 21 | Fill #0

## 2017-10-30 MED FILL — tiZANidine HCL 4 MG TABS: 4 | 8 days supply | Qty: 30 | Fill #0

## 2017-12-04 ENCOUNTER — Encounter: Payer: 59 | Attending: Surgery | Admitting: Skilled Nursing Facility1

## 2017-12-04 DIAGNOSIS — Z6837 Body mass index (BMI) 37.0-37.9, adult: Secondary | ICD-10-CM | POA: Diagnosis not present

## 2017-12-04 DIAGNOSIS — Z713 Dietary counseling and surveillance: Secondary | ICD-10-CM | POA: Insufficient documentation

## 2017-12-04 DIAGNOSIS — Z9884 Bariatric surgery status: Secondary | ICD-10-CM | POA: Insufficient documentation

## 2017-12-04 DIAGNOSIS — E669 Obesity, unspecified: Secondary | ICD-10-CM

## 2017-12-05 ENCOUNTER — Encounter: Payer: Self-pay | Admitting: Skilled Nursing Facility1

## 2017-12-05 ENCOUNTER — Telehealth: Payer: Self-pay | Admitting: Skilled Nursing Facility1

## 2017-12-05 NOTE — Progress Notes (Signed)
Follow-up visit:  Post-Operative sleeve Surgery  Medical Nutrition Therapy:  Appt start time: 6:00pm end time:  7:00pm  Primary concerns today: Post-operative Bariatric Surgery Nutrition Management 6 Month Post-Op Class  Pt was tearful throughout the appt discussing her journey to self love and acceptance. Pt complains of hair loss.   Surgery date: 12/04/2016 Surgery type: Sleeve gastrectomy [facility]: 293.3 Weight today: 205.6 Weight change: 3 Weight loss goal: less than 200 lbs, size 10  TANITA  BODY COMP RESULTS  01/31/2017 05/01/2017 09/05/2017 12/05/2017   BMI (kg/m^2) 46.1 41.1 37.5 37.6   Fat Mass (lbs) 128.6 110.0 91.0 87.2   Fat Free Mass (lbs) 123.6 118.6 117.2 118.4   Total Body Water (lbs) 91.6 87.2 85.4 86    Information Reviewed/ Discussed During Appointment: -Review of composition scale numbers -Fluid requirements (64-100 ounces) -Protein requirements (60-80g) -Strategies for tolerating diet -Advancement of diet to include Starchy vegetables -Barriers to inclusion of new foods -Inclusion of appropriate multivitamin and calcium supplements  -Exercise recommendations   Fluid intake: 50+  Medications: see list Supplementation: yes but nausea will get a capsule  Using straws: no Drinking while eating: no Having you been chewing well: yes Chewing/swallowing difficulties: no Changes in vision: no Changes to mood/headaches: no Hair loss/Cahnges to skin/Changes to nails: no Any difficulty focusing or concentrating: no Sweating: no Dizziness/Lightheaded: no Palpitations: no  Carbonated beverages: no N/V/D/C/GAS: no Abdominal Pain: no Dumping syndrome: no  Recent physical activity:  yes  Progress Towards Goal(s):  In progress.  Handouts given during visit include:  Phase V diet Progression   Goals Sheet  The Benefits of Exercise are endless.....  Support Group Topics  Teaching Method Utilized:  Visual Auditory Hands  on   Demonstrated degree of understanding via:  Teach Back   Monitoring/Evaluation:  Dietary intake, exercise, and body weight. Follow up in 3 months for 9 month post-op visit.

## 2017-12-05 NOTE — Telephone Encounter (Signed)
Dietitian mailed pt past tenita results.  

## 2017-12-17 DIAGNOSIS — G8929 Other chronic pain: Secondary | ICD-10-CM | POA: Diagnosis not present

## 2017-12-17 DIAGNOSIS — M5442 Lumbago with sciatica, left side: Secondary | ICD-10-CM | POA: Diagnosis not present

## 2017-12-18 DIAGNOSIS — G8929 Other chronic pain: Secondary | ICD-10-CM | POA: Diagnosis not present

## 2017-12-18 DIAGNOSIS — M545 Low back pain: Secondary | ICD-10-CM | POA: Diagnosis not present

## 2017-12-18 DIAGNOSIS — M5442 Lumbago with sciatica, left side: Secondary | ICD-10-CM | POA: Diagnosis not present

## 2017-12-30 DIAGNOSIS — G8929 Other chronic pain: Secondary | ICD-10-CM | POA: Diagnosis not present

## 2017-12-30 DIAGNOSIS — M5442 Lumbago with sciatica, left side: Secondary | ICD-10-CM | POA: Diagnosis not present

## 2017-12-30 DIAGNOSIS — M545 Low back pain: Secondary | ICD-10-CM | POA: Diagnosis not present

## 2018-01-03 MED FILL — GABAPENTIN 300 MG CAPSULE: 300 | 30 days supply | Qty: 90 | Fill #0

## 2018-02-04 DIAGNOSIS — M5116 Intervertebral disc disorders with radiculopathy, lumbar region: Secondary | ICD-10-CM | POA: Diagnosis not present

## 2018-02-04 DIAGNOSIS — Z6838 Body mass index (BMI) 38.0-38.9, adult: Secondary | ICD-10-CM | POA: Diagnosis not present

## 2018-02-04 DIAGNOSIS — E6609 Other obesity due to excess calories: Secondary | ICD-10-CM | POA: Diagnosis not present

## 2018-02-19 MED FILL — FLUCONAZOLE 150 MG TABS: 150 | 2 days supply | Qty: 2 | Fill #0

## 2018-02-21 ENCOUNTER — Emergency Department (INDEPENDENT_AMBULATORY_CARE_PROVIDER_SITE_OTHER)
Admission: EM | Admit: 2018-02-21 | Discharge: 2018-02-21 | Disposition: A | Discharge status: Home / Self Care | Payer: 59 | Source: Home / Self Care

## 2018-02-21 ENCOUNTER — Encounter: Payer: Self-pay | Admitting: Family Medicine

## 2018-02-21 DIAGNOSIS — N898 Other specified noninflammatory disorders of vagina: Secondary | ICD-10-CM | POA: Diagnosis not present

## 2018-02-21 LAB — POCT URINALYSIS DIP (MANUAL ENTRY)
Bilirubin, UA: NEGATIVE
Blood, UA: NEGATIVE
Glucose, UA: NEGATIVE mg/dL
Leukocytes, UA: NEGATIVE
Nitrite, UA: NEGATIVE
Protein Ur, POC: NEGATIVE mg/dL
Spec Grav, UA: 1.02 (ref 1.010–1.025)
Urobilinogen, UA: 0.2 E.U./dL
pH, UA: 7 (ref 5.0–8.0)

## 2018-02-21 MED ORDER — METRONIDAZOLE 0.75 % VA GEL: 1.0000 | Freq: Two times a day (BID) | VAGINAL | 0 refills | Status: DC

## 2018-02-21 NOTE — ED Triage Notes (Signed)
Tracy PhenixJuliza complains of vaginal discharge and itching Sunday night. She was treated with diflucan and Monistat. Now she has vaginal burning and low back pain. Denies fever, chills or sweats.

## 2018-02-21 NOTE — ED Provider Notes (Signed)
[facility]    CSN: [id number] Arrival date & time: 02/21/18  1928     History   Chief Complaint Chief Complaint  Patient presents with  . Vaginal Discharge  . Back Pain    HPI Tracy Brooks is a 27 y.o. female.   27 yo woman making initial [facility] visit for vaginal discomfort.  Patient has an IUD and has irregular periods.  She is had a white discharge for couple days and she is taken 2 doses of Diflucan which have not reduced vaginal irritation and urinary frequency.  She is had no abdominal pain.  She has had a little low back pain.  There is been no fever or unusual bleeding.  Patient is concerned that she might have GC or chlamydia.     Past Medical History:  Diagnosis Date  . Anxiety    no meds  . Asthma    Child hood seasonal now  . Complication of anesthesia    very upset when wakes up emotional crying until sees dad.  Marland Kitchen Headache    hx of migraines    Patient Active Problem List   Diagnosis Date Noted  . S/P laparoscopic sleeve gastrectomy Sept 2018 12/04/2016    Past Surgical History:  Procedure Laterality Date  . LAPAROSCOPIC GASTRIC SLEEVE RESECTION    . LAPAROSCOPIC GASTRIC SLEEVE RESECTION N/A 12/04/2016   Procedure: LAPAROSCOPIC GASTRIC SLEEVE RESECTION WITH UPPER ENDOSCOPY;  Surgeon: Luretha Murphy, MD;  Location: [facility];  Service: General;  Laterality: N/A;  . TONSILLECTOMY      OB History   None      Home Medications    Prior to Admission medications   Medication Sig Start Date End Date Taking? Authorizing Provider  busPIRone (BUSPAR) 10 MG tablet Take 10 mg by mouth 3 (three) times daily.   Yes [provider]  Levonorgestrel (SKYLA) 13.5 MG IUD by Intrauterine route.   Yes [provider]  Cholecalciferol (VITAMIN D PO) Take 1 capsule by mouth daily.    [provider]  metroNIDAZOLE (METROGEL VAGINAL) 0.75 % vaginal gel Place 1 Applicatorful vaginally 2 (two) times daily. 02/21/18   Elvina Sidle,  MD  Multiple Vitamin (MULTIVITAMIN WITH MINERALS) TABS tablet Take 1 tablet by mouth daily.    [provider]  oxyCODONE (ROXICODONE) 5 MG/5ML solution Take 5 mg by mouth every 4 (four) hours as needed for severe pain.    [provider]  Simethicone (GAS RELIEF 125 MAX ST PO) Take by mouth as needed.    [provider]    Family History Family History  Problem Relation Age of Onset  . Asthma Other   . Cancer Other   . COPD Other   . Hypertension Other   . Heart disease Other   . Diabetes Other     Social History Social History   Tobacco Use  . Smoking status: Never Smoker  . Smokeless tobacco: Never Used  Substance Use Topics  . Alcohol use: No  . Drug use: No     Allergies   Metronidazole and Tape   Review of Systems Review of Systems   Physical Exam Triage Vital Signs ED Triage Vitals  Enc Vitals Group     BP      Pulse      Resp      Temp      Temp src      SpO2      Weight      Height  Head Circumference      Peak Flow      Pain Score      Pain Loc      Pain Edu?      Excl. in GC?    No data found.  Updated Vital Signs BP 138/86 (BP Location: Right Arm)   Pulse 77   Temp 98.3 F (36.8 C) (Oral)   Resp 16   Ht 5\' 2"  (1.575 m)   Wt 90.7 kg   SpO2 100%   BMI 36.58 kg/m    Physical Exam   UC Treatments / Results  Labs (all labs ordered are listed, but only abnormal results are displayed) Labs Reviewed  CYTOLOGY, (ORAL, ANAL, URETHRAL) ANCILLARY ONLY    EKG None  Radiology No results found.  Procedures Procedures (including critical care time)  Medications Ordered in UC Medications - No data to display  Initial Impression / Assessment and Plan / UC Course  I have reviewed the triage vital signs and the nursing notes.  Pertinent labs & imaging results that were available during my care of the patient were reviewed by me and considered in my medical decision making (see chart for  details).    Final Clinical Impressions(s) / UC Diagnoses   Final diagnoses:  Vaginal discharge   Discharge Instructions   None    ED Prescriptions    Medication Sig Dispense Auth. Provider   metroNIDAZOLE (METROGEL VAGINAL) 0.75 % vaginal gel Place 1 Applicatorful vaginally 2 (two) times daily. 70 g Elvina SidleLauenstein, Arletta Lumadue, MD     Controlled Substance Prescriptions [place] Controlled Substance Registry consulted? Not Applicable   Elvina SidleLauenstein, [place] Behlke, MD 02/21/18 [phone]

## 2018-02-22 MED FILL — metroNIDAZOLE 0.75 % GEL: 0.75 | 7 days supply | Qty: 70 | Fill #0

## 2018-02-23 ENCOUNTER — Telehealth: Payer: Self-pay | Admitting: Emergency Medicine

## 2018-02-23 LAB — C. TRACHOMATIS/N. GONORRHOEAE RNA
C. trachomatis RNA, TMA: NOT DETECTED
N. gonorrhoeae RNA, TMA: NOT DETECTED

## 2018-02-23 NOTE — Telephone Encounter (Signed)
Pt here for STI results. Negative results given. Advised to continue treatment for BV. Verbalized understanding.     None

## 2018-03-02 IMAGING — DX DG CHEST 2V
2 series · 2 of 2 positions shown · non-contrast
Comparison: 04/01/2016

CLINICAL DATA: Preoperative evaluation for upcoming bariatric
surgery

EXAM:
CHEST  2 VIEW

[chest pa]
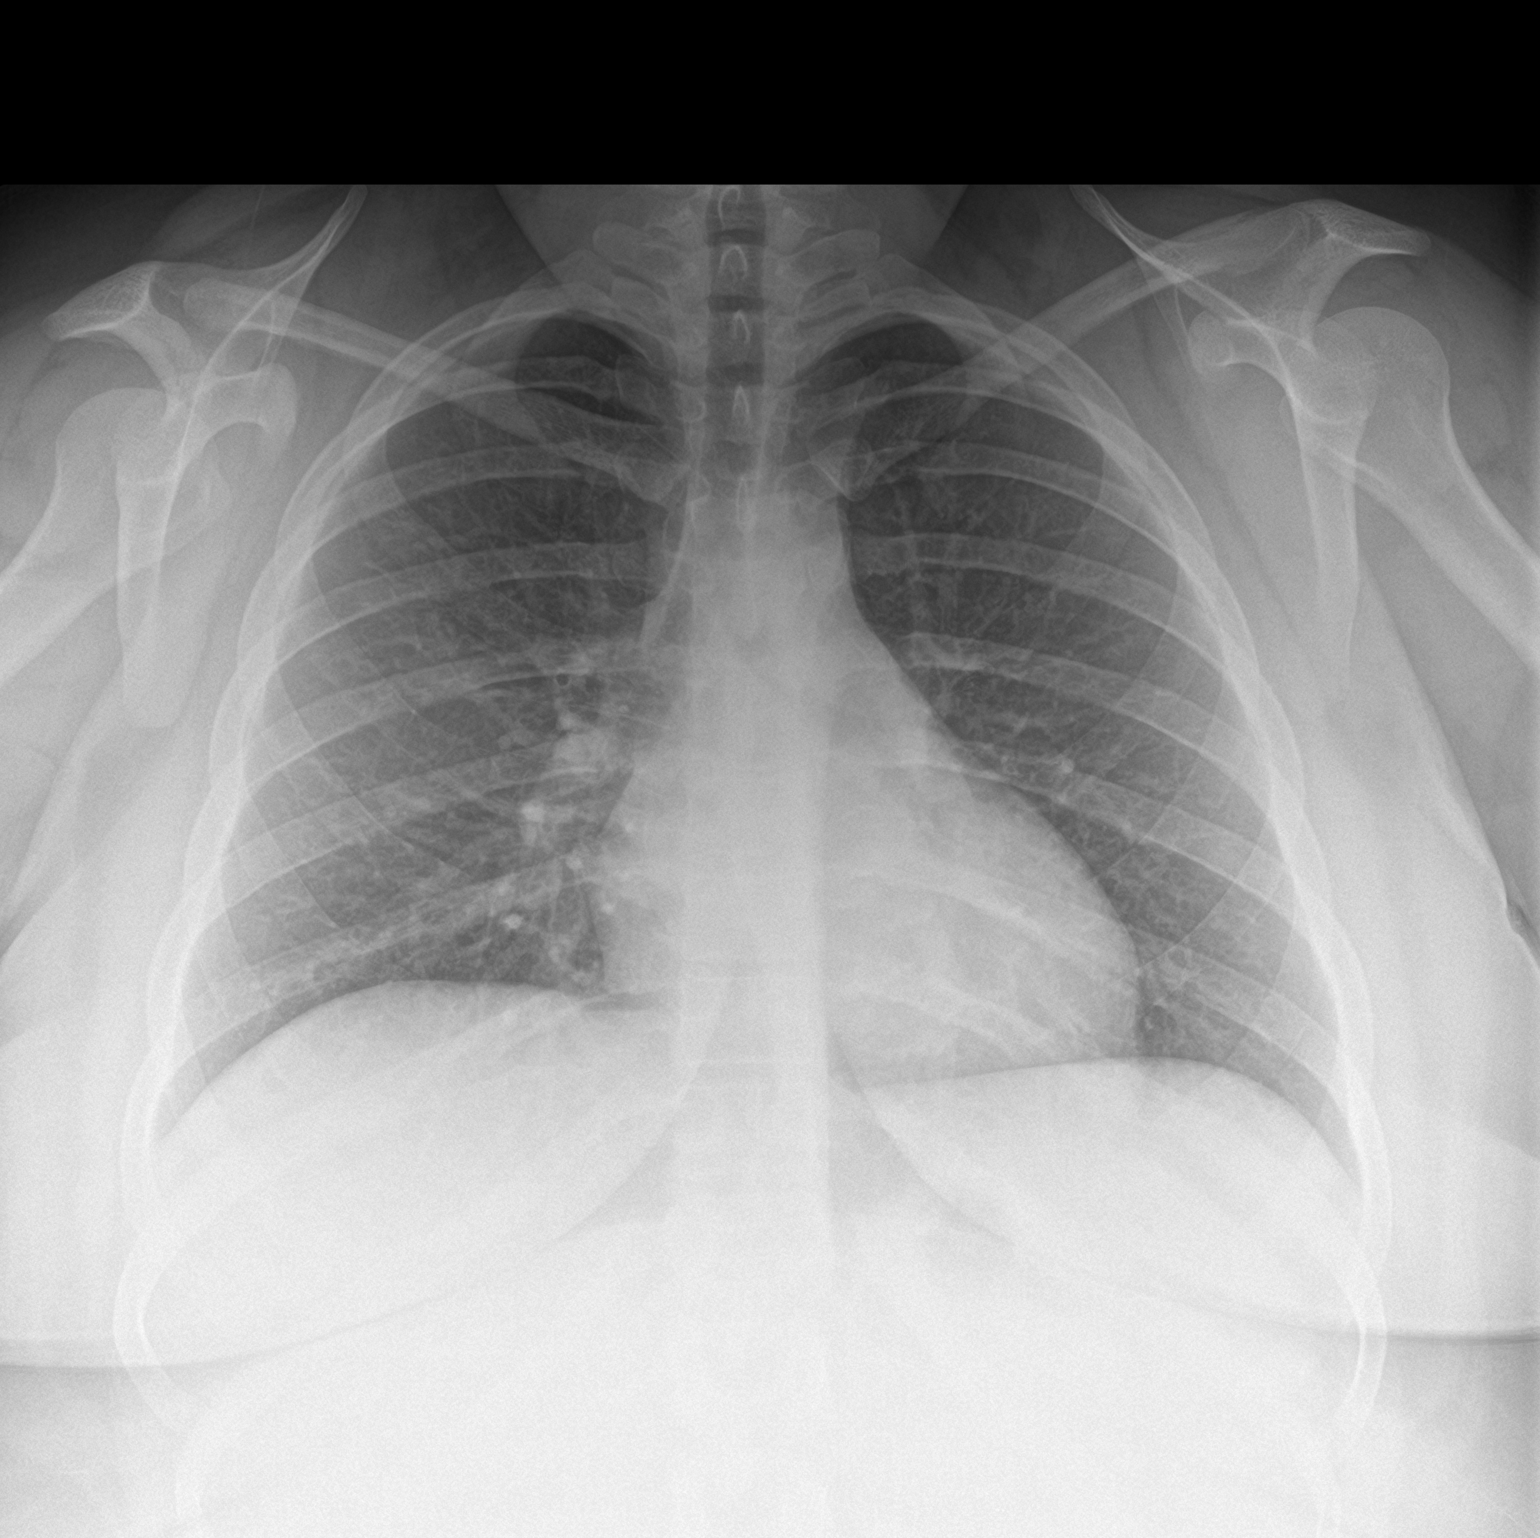

[chest lat]
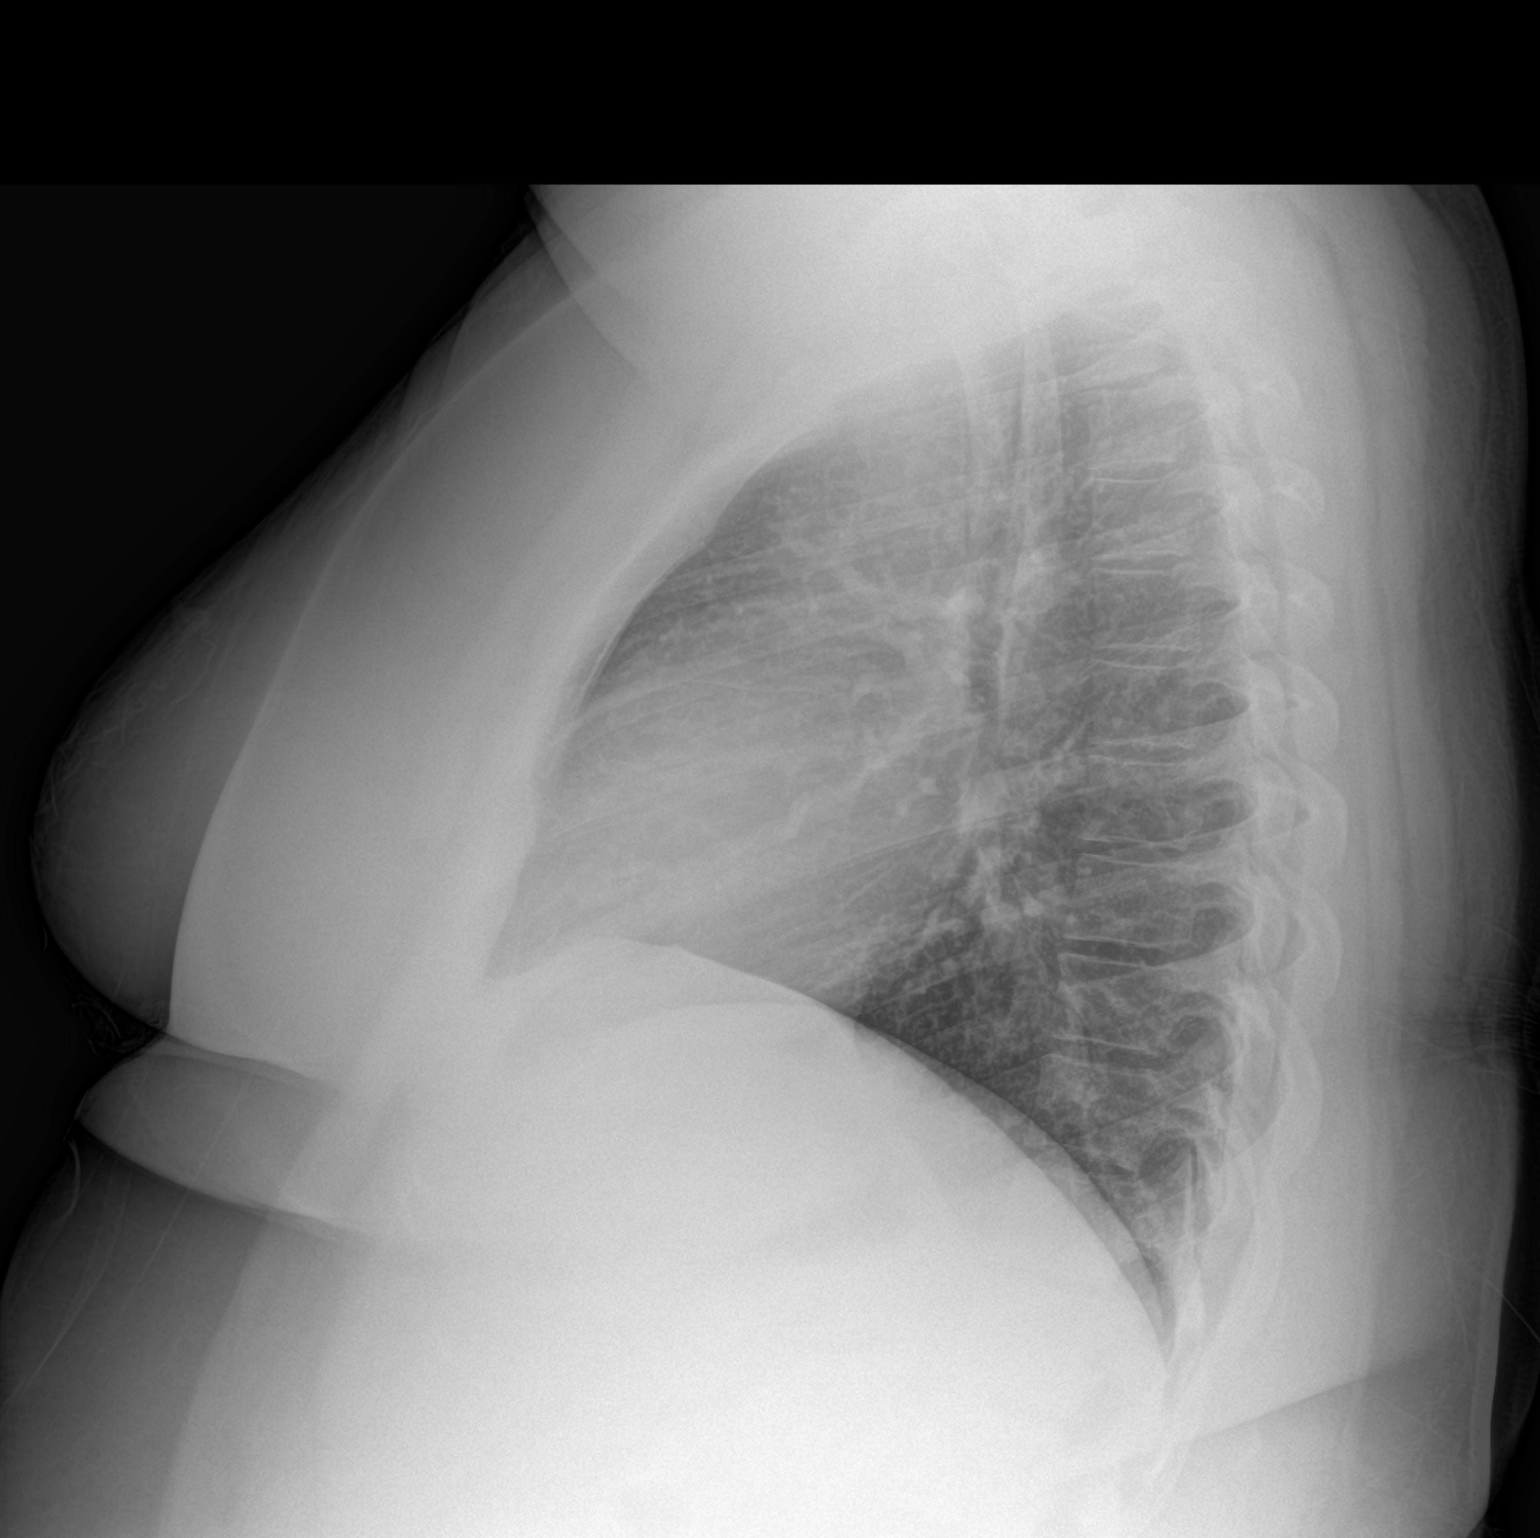

[2 of 2 positions shown; findings below may reference images not displayed]

FINDINGS: The heart size and mediastinal contours are within normal limits.
Both lungs are clear. The visualized skeletal structures are
unremarkable.
IMPRESSION: No active cardiopulmonary disease.

## 2018-03-02 IMAGING — CR DG UGI W/ KUB
1 series · 1 of 1 positions shown · non-contrast
Comparison: 10/14/2015 CT.

CLINICAL DATA: 25-year-old female pre gastric sleeve procedure. No
complaints.

EXAM:
UPPER GI SERIES WITH KUB
TECHNIQUE: After obtaining a scout radiograph a routine upper GI series was
performed using thin barium per protocol.
FLUOROSCOPY TIME:  Fluoroscopy Time:  1 minutes and 42 seconds
Radiation Exposure Index (if provided by the fluoroscopic device):
46.3 mGy

[t abdomen supine]
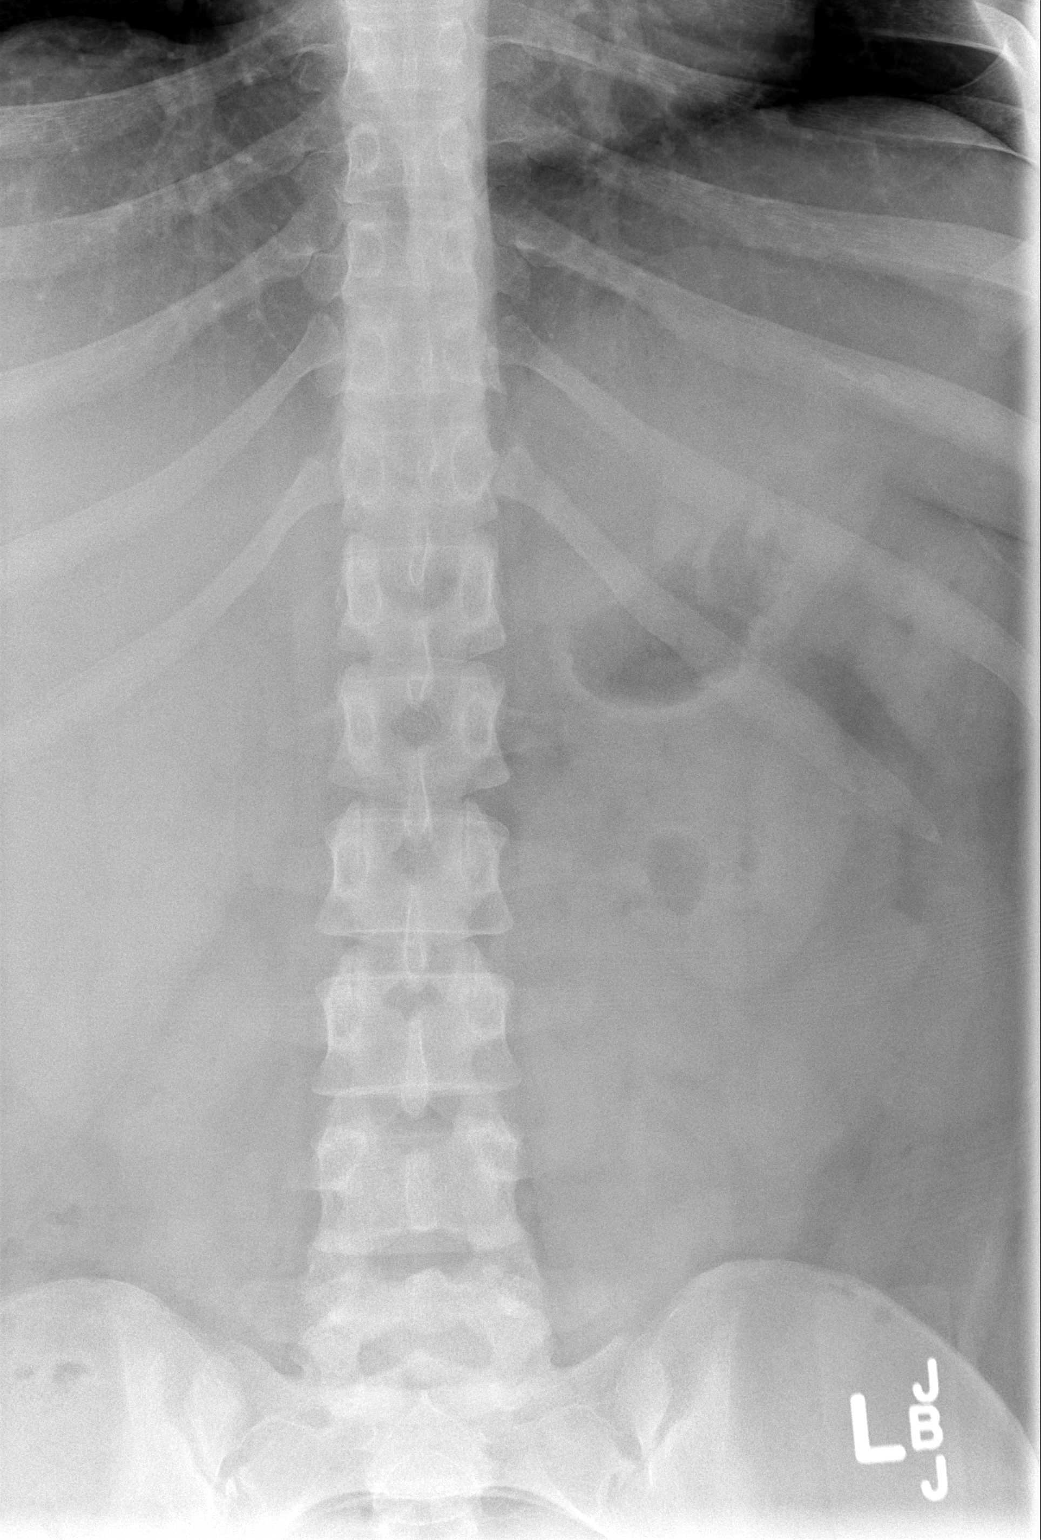

[1 of 1 positions shown; findings below may reference images not displayed]

FINDINGS: Scout view unremarkable.

Normal primary esophageal stripping wave without esophageal
obstructing or constricting lesion. No reflux.

Gastric contour within normal limits without ulceration or obvious
mass noted on this single contrast exam.

Duodenal bulb within normal limits without ulceration. Proximally
visualized small bowel unremarkable.
IMPRESSION: Negative single contrast upper GI series.

## 2018-03-04 DIAGNOSIS — J209 Acute bronchitis, unspecified: Secondary | ICD-10-CM | POA: Diagnosis not present

## 2018-03-04 MED FILL — PROMETHAZINE W/DM SYRUP: 6.25-15 | 6 days supply | Qty: 118 | Fill #0

## 2018-03-04 MED FILL — AZITHROMYCIN 250 MG TABLET: 250 | 5 days supply | Qty: 6 | Fill #0

## 2018-03-14 DIAGNOSIS — M5116 Intervertebral disc disorders with radiculopathy, lumbar region: Secondary | ICD-10-CM | POA: Diagnosis not present

## 2018-03-14 DIAGNOSIS — M25651 Stiffness of right hip, not elsewhere classified: Secondary | ICD-10-CM | POA: Diagnosis not present

## 2018-03-14 DIAGNOSIS — M25652 Stiffness of left hip, not elsewhere classified: Secondary | ICD-10-CM | POA: Diagnosis not present

## 2018-03-18 DIAGNOSIS — M25652 Stiffness of left hip, not elsewhere classified: Secondary | ICD-10-CM | POA: Diagnosis not present

## 2018-03-18 DIAGNOSIS — M5116 Intervertebral disc disorders with radiculopathy, lumbar region: Secondary | ICD-10-CM | POA: Diagnosis not present

## 2018-03-18 DIAGNOSIS — M25651 Stiffness of right hip, not elsewhere classified: Secondary | ICD-10-CM | POA: Diagnosis not present

## 2018-03-20 ENCOUNTER — Encounter: Payer: Self-pay | Admitting: Emergency Medicine

## 2018-03-20 ENCOUNTER — Emergency Department (INDEPENDENT_AMBULATORY_CARE_PROVIDER_SITE_OTHER)
Admission: EM | Admit: 2018-03-20 | Discharge: 2018-03-20 | Disposition: A | Discharge status: Home / Self Care | Payer: 59 | Source: Home / Self Care | Attending: Internal Medicine | Admitting: Internal Medicine

## 2018-03-20 DIAGNOSIS — J40 Bronchitis, not specified as acute or chronic: Secondary | ICD-10-CM | POA: Diagnosis not present

## 2018-03-20 MED ORDER — PREDNISONE 10 MG (21) PO TBPK: ORAL_TABLET | Freq: Every day | ORAL | 0 refills | Status: DC

## 2018-03-20 NOTE — ED Provider Notes (Signed)
[facility]    CSN: [id number] Arrival date & time: 03/20/18  1620     History   Chief Complaint Chief Complaint  Patient presents with  . Cough    HPI Tracy Brooks is a 28 y.o. female.   54-year-old female status post gastric bypass with past medical history significant for asthma presents to urgent care complaining of cough.  The patient was treated for bronchitis 1 week ago and states that she is better to some degree but feels as if the rattling in her chest and pain with sporadic cough is actually tougher to deal with than when she was sick 1 week ago.  She denies fever or shortness of breath.  She states that her cough is not productive but that she feels mucus in her chest.  She is used some cough medicine and DayQuil over-the-counter without relief.     Past Medical History:  Diagnosis Date  . Anxiety    no meds  . Asthma    Child hood seasonal now  . Complication of anesthesia    very upset when wakes up emotional crying until sees dad.  Marland Kitchen Headache    hx of migraines    Patient Active Problem List   Diagnosis Date Noted  . S/P laparoscopic sleeve gastrectomy Sept 2018 12/04/2016    Past Surgical History:  Procedure Laterality Date  . LAPAROSCOPIC GASTRIC SLEEVE RESECTION    . LAPAROSCOPIC GASTRIC SLEEVE RESECTION N/A 12/04/2016   Procedure: LAPAROSCOPIC GASTRIC SLEEVE RESECTION WITH UPPER ENDOSCOPY;  Surgeon: Luretha Murphy, MD;  Location: [facility];  Service: General;  Laterality: N/A;  . TONSILLECTOMY      OB History   No obstetric history on file.      Home Medications    Prior to Admission medications   Medication Sig Start Date End Date Taking? Authorizing Provider  albuterol (PROVENTIL HFA;VENTOLIN HFA) 108 (90 Base) MCG/ACT inhaler Inhale into the lungs. 08/05/17  Yes [provider]  azithromycin (ZITHROMAX) 250 MG tablet Take two tablets on the first day and then one tablet every day after. 03/04/18  Yes [provider]  busPIRone (BUSPAR) 10 MG tablet Take 10 mg by mouth 3 (three) times daily.    [provider]  Levonorgestrel (SKYLA) 13.5 MG IUD by Intrauterine route.    [provider]  metroNIDAZOLE (METROGEL VAGINAL) 0.75 % vaginal gel Place 1 Applicatorful vaginally 2 (two) times daily. 02/21/18   Elvina Sidle, MD  Multiple Vitamin (MULTIVITAMIN WITH MINERALS) TABS tablet Take 1 tablet by mouth daily.    [provider]  predniSONE (STERAPRED UNI-PAK 21 TAB) 10 MG (21) TBPK tablet Take by mouth daily. Take 6 tabs by mouth daily  for 2 days, then 5 tabs for 2 days, then 4 tabs for 2 days, then 3 tabs for 2 days, 2 tabs for 2 days, then 1 tab by mouth daily for 2 days 03/20/18   Arnaldo Natal, MD    Family History Family History  Problem Relation Age of Onset  . Asthma Other   . Cancer Other   . COPD Other   . Hypertension Other   . Heart disease Other   . Diabetes Other   . Diabetes Mother     Social History Social History   Tobacco Use  . Smoking status: Never Smoker  . Smokeless tobacco: Never Used  Substance Use Topics  . Alcohol use: No  . Drug use: No     Allergies  Metronidazole and Tape   Review of Systems Review of Systems  Constitutional: Negative for chills and fever.  HENT: Negative for sore throat and tinnitus.   Eyes: Negative for redness.  Respiratory: Positive for cough. Negative for shortness of breath.   Cardiovascular: Negative for chest pain and palpitations.  Gastrointestinal: Negative for abdominal pain, diarrhea, nausea and vomiting.  Genitourinary: Negative for dysuria, frequency and urgency.  Musculoskeletal: Negative for myalgias.  Skin: Negative for rash.       No lesions  Neurological: Negative for weakness.  Hematological: Does not bruise/bleed easily.  Psychiatric/Behavioral: Negative for suicidal ideas.     Physical Exam Triage Vital Signs ED Triage Vitals  Enc Vitals Group     BP 03/20/18  1641 124/85     Pulse Rate 03/20/18 1641 96     Resp --      Temp 03/20/18 1641 98.3 F (36.8 C)     Temp Source 03/20/18 1641 Oral     SpO2 03/20/18 1641 97 %     Weight 03/20/18 1642 200 lb (90.7 kg)     Height --      Head Circumference --      Peak Flow --      Pain Score 03/20/18 1642 0     Pain Loc --      Pain Edu? --      Excl. in GC? --    No data found.  Updated Vital Signs BP 124/85 (BP Location: Right Arm)   Pulse 96   Temp 98.3 F (36.8 C) (Oral)   Wt 90.7 kg   SpO2 97%   BMI 36.58 kg/m   Visual Acuity Right Eye Distance:   Left Eye Distance:   Bilateral Distance:    Right Eye Near:   Left Eye Near:    Bilateral Near:     Physical Exam Vitals signs and nursing note reviewed.  Constitutional:      General: She is not in acute distress.    Appearance: She is well-developed.  HENT:     Head: Normocephalic and atraumatic.  Eyes:     General: No scleral icterus.    Conjunctiva/sclera: Conjunctivae normal.     Pupils: Pupils are equal, round, and reactive to light.  Neck:     Musculoskeletal: Normal range of motion and neck supple.     Thyroid: No thyromegaly.     Vascular: No JVD.     Trachea: No tracheal deviation.  Cardiovascular:     Rate and Rhythm: Normal rate and regular rhythm.     Heart sounds: Normal heart sounds. No murmur. No friction rub. No gallop.   Pulmonary:     Effort: Pulmonary effort is normal.     Breath sounds: Normal breath sounds.  Abdominal:     General: Bowel sounds are normal. There is no distension.     Palpations: Abdomen is soft.     Tenderness: There is no abdominal tenderness.  Musculoskeletal: Normal range of motion.  Lymphadenopathy:     Cervical: No cervical adenopathy.  Skin:    General: Skin is warm and dry.  Neurological:     Mental Status: She is alert and oriented to person, place, and time.     Cranial Nerves: No cranial nerve deficit.  Psychiatric:        Behavior: Behavior normal.        Thought  Content: Thought content normal.        Judgment: Judgment normal.  UC Treatments / Results  Labs (all labs ordered are listed, but only abnormal results are displayed) Labs Reviewed - No data to display  EKG None  Radiology No results found.  Procedures Procedures (including critical care time)  Medications Ordered in UC Medications - No data to display  Initial Impression / Assessment and Plan / UC Course  I have reviewed the triage vital signs and the nursing notes.  Pertinent labs & imaging results that were available during my care of the patient were reviewed by me and considered in my medical decision making (see chart for details).     Lungs clear on physical exam.  Symptoms consistent with bronchitis.  Rx steroid taper  Final Clinical Impressions(s) / UC Diagnoses   Final diagnoses:  Bronchitis   Discharge Instructions   None    ED Prescriptions    Medication Sig Dispense Auth. Provider   predniSONE (STERAPRED UNI-PAK 21 TAB) 10 MG (21) TBPK tablet Take by mouth daily. Take 6 tabs by mouth daily  for 2 days, then 5 tabs for 2 days, then 4 tabs for 2 days, then 3 tabs for 2 days, 2 tabs for 2 days, then 1 tab by mouth daily for 2 days 42 tablet Arnaldo Natal, MD     Controlled Substance Prescriptions [place] Controlled Substance Registry consulted? Not Applicable   Arnaldo Natal, MD 03/20/18 [phone]

## 2018-03-20 NOTE — ED Triage Notes (Signed)
Pt states she was treated with zpak for bronchitis about 1 week ago and is feeling better but not completely normal. Just wants to make sure she is improved.

## 2018-03-25 DIAGNOSIS — M25651 Stiffness of right hip, not elsewhere classified: Secondary | ICD-10-CM | POA: Diagnosis not present

## 2018-03-25 DIAGNOSIS — M25652 Stiffness of left hip, not elsewhere classified: Secondary | ICD-10-CM | POA: Diagnosis not present

## 2018-03-25 DIAGNOSIS — M5116 Intervertebral disc disorders with radiculopathy, lumbar region: Secondary | ICD-10-CM | POA: Diagnosis not present

## 2018-04-01 DIAGNOSIS — M25651 Stiffness of right hip, not elsewhere classified: Secondary | ICD-10-CM | POA: Diagnosis not present

## 2018-04-01 DIAGNOSIS — M25652 Stiffness of left hip, not elsewhere classified: Secondary | ICD-10-CM | POA: Diagnosis not present

## 2018-04-01 DIAGNOSIS — M5116 Intervertebral disc disorders with radiculopathy, lumbar region: Secondary | ICD-10-CM | POA: Diagnosis not present

## 2018-04-10 DIAGNOSIS — M25652 Stiffness of left hip, not elsewhere classified: Secondary | ICD-10-CM | POA: Diagnosis not present

## 2018-04-10 DIAGNOSIS — M25651 Stiffness of right hip, not elsewhere classified: Secondary | ICD-10-CM | POA: Diagnosis not present

## 2018-04-10 DIAGNOSIS — M5116 Intervertebral disc disorders with radiculopathy, lumbar region: Secondary | ICD-10-CM | POA: Diagnosis not present

## 2018-04-15 DIAGNOSIS — M25652 Stiffness of left hip, not elsewhere classified: Secondary | ICD-10-CM | POA: Diagnosis not present

## 2018-04-15 DIAGNOSIS — M5116 Intervertebral disc disorders with radiculopathy, lumbar region: Secondary | ICD-10-CM | POA: Diagnosis not present

## 2018-04-15 DIAGNOSIS — M25651 Stiffness of right hip, not elsewhere classified: Secondary | ICD-10-CM | POA: Diagnosis not present

## 2018-04-25 ENCOUNTER — Other Ambulatory Visit: Payer: Self-pay

## 2018-04-25 ENCOUNTER — Emergency Department (INDEPENDENT_AMBULATORY_CARE_PROVIDER_SITE_OTHER)
Admission: EM | Admit: 2018-04-25 | Discharge: 2018-04-25 | Disposition: A | Discharge status: Home / Self Care | Payer: 59 | Source: Home / Self Care | Attending: Family Medicine | Admitting: Family Medicine

## 2018-04-25 DIAGNOSIS — R197 Diarrhea, unspecified: Secondary | ICD-10-CM

## 2018-04-25 DIAGNOSIS — R1084 Generalized abdominal pain: Secondary | ICD-10-CM

## 2018-04-25 LAB — POCT URINALYSIS DIP (MANUAL ENTRY)
Bilirubin, UA: NEGATIVE
Blood, UA: NEGATIVE
Glucose, UA: NEGATIVE mg/dL
Leukocytes, UA: NEGATIVE
Nitrite, UA: NEGATIVE
Spec Grav, UA: 1.025 (ref 1.010–1.025)
Urobilinogen, UA: 1 E.U./dL
pH, UA: 6 (ref 5.0–8.0)

## 2018-04-25 LAB — POCT CBC W AUTO DIFF (K'VILLE URGENT CARE)

## 2018-04-25 LAB — POCT INFLUENZA A/B
Influenza A, POC: NEGATIVE
Influenza B, POC: NEGATIVE

## 2018-04-25 MED ORDER — ACETAMINOPHEN 325 MG PO TABS
650.0000 mg | ORAL_TABLET | Freq: Once | ORAL | Status: AC
  Administered 2018-04-25: 975 mg via ORAL

## 2018-04-25 MED ORDER — ONDANSETRON 4 MG PO TBDP: ORAL_TABLET | ORAL | 0 refills | Status: DC

## 2018-04-25 NOTE — ED Provider Notes (Signed)
[facility]    CSN: [id number] Arrival date & time: 04/25/18  1918     History   Chief Complaint Chief Complaint  Patient presents with  . Nausea  . Diarrhea  . Back Pain    HPI Tracy Brooks is a 28 y.o. female.   Patient was awoke at 3:45am today with watery diarrhea and crampy abdominal pain.  She denies vomiting.  She fell asleep and awoke later with headache, fatigue, myalgias, chills/sweats, and occasional cough.  She denies urinary symptoms.  She denies recent foreign travel, or drinking untreated water in a wilderness environment.  She denies recent antibiotic use.  She has a past history of gastric bypass.  The history is provided by the patient.  Diarrhea  Quality:  Watery Severity:  Moderate Onset quality:  Sudden Number of episodes:  6 Duration:  15 hours Timing:  Sporadic Progression:  Improving Relieved by:  Nothing Exacerbated by: eating. Ineffective treatments:  None tried Associated symptoms: abdominal pain, chills, cough, diaphoresis, headaches and myalgias   Associated symptoms: no fever, no URI and no vomiting   Risk factors: no recent antibiotic use, no sick contacts, no suspicious food intake and no travel to endemic areas     Past Medical History:  Diagnosis Date  . Anxiety    no meds  . Asthma    Child hood seasonal now  . Complication of anesthesia    very upset when wakes up emotional crying until sees dad.  Marland Kitchen. Headache    hx of migraines    Patient Active Problem List   Diagnosis Date Noted  . S/P laparoscopic sleeve gastrectomy Sept 2018 12/04/2016    Past Surgical History:  Procedure Laterality Date  . LAPAROSCOPIC GASTRIC SLEEVE RESECTION    . LAPAROSCOPIC GASTRIC SLEEVE RESECTION N/A 12/04/2016   Procedure: LAPAROSCOPIC GASTRIC SLEEVE RESECTION WITH UPPER ENDOSCOPY;  Surgeon: Luretha MurphyMartin, Matthew, MD;  Location: [facility];  Service: General;  Laterality: N/A;  . TONSILLECTOMY      OB History   No obstetric history on  file.      Home Medications    Prior to Admission medications   Medication Sig Start Date End Date Taking? Authorizing Provider  albuterol (PROVENTIL HFA;VENTOLIN HFA) 108 (90 Base) MCG/ACT inhaler Inhale into the lungs. 08/05/17   [provider]  azithromycin (ZITHROMAX) 250 MG tablet Take two tablets on the first day and then one tablet every day after. 03/04/18   [provider]  busPIRone (BUSPAR) 10 MG tablet Take 10 mg by mouth 3 (three) times daily.    [provider]  Levonorgestrel (SKYLA) 13.5 MG IUD by Intrauterine route.    [provider]  metroNIDAZOLE (METROGEL VAGINAL) 0.75 % vaginal gel Place 1 Applicatorful vaginally 2 (two) times daily. 02/21/18   Elvina SidleLauenstein, Kurt, MD  Multiple Vitamin (MULTIVITAMIN WITH MINERALS) TABS tablet Take 1 tablet by mouth daily.    [provider]  ondansetron (ZOFRAN ODT) 4 MG disintegrating tablet Take one tab by mouth Q6hr prn nausea.  Dissolve under tongue. 04/25/18   Lattie HawBeese, Stephen A, MD  predniSONE (STERAPRED UNI-PAK 21 TAB) 10 MG (21) TBPK tablet Take by mouth daily. Take 6 tabs by mouth daily  for 2 days, then 5 tabs for 2 days, then 4 tabs for 2 days, then 3 tabs for 2 days, 2 tabs for 2 days, then 1 tab by mouth daily for 2 days 03/20/18   Arnaldo Nataliamond, Michael S, MD    Family History Family  History  Problem Relation Age of Onset  . Asthma Other   . Cancer Other   . COPD Other   . Hypertension Other   . Heart disease Other   . Diabetes Other   . Diabetes Mother     Social History Social History   Tobacco Use  . Smoking status: Never Smoker  . Smokeless tobacco: Never Used  Substance Use Topics  . Alcohol use: No  . Drug use: No     Allergies   Metronidazole and Tape   Review of Systems Review of Systems  Constitutional: Positive for chills and diaphoresis. Negative for fever.  HENT: Negative.   Eyes: Negative.   Respiratory: Negative.   Cardiovascular: Negative.     Gastrointestinal: Positive for abdominal pain, diarrhea and nausea. Negative for vomiting.  Musculoskeletal: Positive for myalgias.  Skin: Negative.   Neurological: Positive for headaches.  All other systems reviewed and are negative.    Physical Exam Triage Vital Signs ED Triage Vitals  Enc Vitals Group     BP 04/25/18 1941 113/79     Pulse Rate 04/25/18 1941 96     Resp --      Temp 04/25/18 1941 98.1 F (36.7 C)     Temp Source 04/25/18 1941 Oral     SpO2 04/25/18 1941 98 %     Weight 04/25/18 1942 206 lb (93.4 kg)     Height 04/25/18 1942 5\' 3"  (1.6 m)     Head Circumference --      Peak Flow --      Pain Score 04/25/18 1942 4     Pain Loc --      Pain Edu? --      Excl. in GC? --    No data found.  Updated Vital Signs BP 113/79 (BP Location: Right Arm)   Pulse 96   Temp 98.1 F (36.7 C) (Oral)   Ht 5\' 3"  (1.6 m)   Wt 93.4 kg   SpO2 98%   BMI 36.49 kg/m   Visual Acuity Right Eye Distance:   Left Eye Distance:   Bilateral Distance:    Right Eye Near:   Left Eye Near:    Bilateral Near:     Physical Exam Nursing notes and Vital Signs reviewed. Appearance:  Patient appears stated age, and in no acute distress Eyes:  Pupils are equal, round, and reactive to light and accomodation.  Extraocular movement is intact.  Conjunctivae are not inflamed  Ears:  Canals normal.  Tympanic membranes normal.  Nose:  Mildly congested turbinates.  No sinus tenderness.   Pharynx:  Normal Neck:  Supple.  Enlarged posterior/lateral nodes are palpated bilaterally, tender to palpation on the left.   Lungs:  Clear to auscultation.  Breath sounds are equal.  Moving air well. Heart:  Regular rate and rhythm without murmurs, rubs, or gallops.  Abdomen:   Mild diffuse central abdominal tenderness without masses or hepatosplenomegaly.  Bowel sounds are present but decreased.  No CVA or flank tenderness.  Extremities:  No edema.  Skin:  No rash present.    UC Treatments / Results   Labs (all labs ordered are listed, but only abnormal results are displayed) Labs Reviewed  POCT URINALYSIS DIP (MANUAL ENTRY) - Abnormal; Notable for the following components:      Result Value   Ketones, POC UA trace (5) (*)    Protein Ur, POC trace (*)    All other components within normal limits  POCT INFLUENZA  A/B negative  POCT CBC W AUTO DIFF ([facility]):  WBC 7.3; LY 10.3; MO 2.6; GR 87.1; Hgb 13.1; Platelets 126     EKG None  Radiology No results found.  Procedures Procedures (including critical care time)  Medications Ordered in UC Medications  acetaminophen (TYLENOL) tablet 650 mg (975 mg Oral Given 04/25/18 2005)    Initial Impression / Assessment and Plan / UC Course  I have reviewed the triage vital signs and the nursing notes.  Pertinent labs & imaging results that were available during my care of the patient were reviewed by me and considered in my medical decision making (see chart for details).    Normal WBC (7.3) reassuring.  ? Viral gastroenteritis. Treat symptomatically for now.  Followup with Family Doctor if not improved in 4 days.   Final Clinical Impressions(s) / UC Diagnoses   Final diagnoses:  Diarrhea, unspecified type  Generalized abdominal pain     Discharge Instructions     Begin clear liquids (Pedialyte while having diarrhea) for a day until improved, then advance to a [employer] (Bananas, Rice, Applesauce, Toast).  Then gradually resume a regular diet when tolerated.  Avoid milk products until well.    May take acetaminophen (Tylenol) 500mg  as needed for pain.  If symptoms become significantly worse during the night or over the weekend, proceed to the local emergency room.    ED Prescriptions    Medication Sig Dispense Auth. Provider   ondansetron (ZOFRAN ODT) 4 MG disintegrating tablet Take one tab by mouth Q6hr prn nausea.  Dissolve under tongue. 12 tablet Lattie Haw, MD        Lattie Haw, MD 04/26/18  [phone]

## 2018-04-25 NOTE — Discharge Instructions (Addendum)
Begin clear liquids (Pedialyte while having diarrhea) for a day until improved, then advance to a [employer] (Bananas, Rice, Applesauce, Toast).  Then gradually resume a regular diet when tolerated.  Avoid milk products until well.    May take acetaminophen (Tylenol) 500mg  as needed for pain.  If symptoms become significantly worse during the night or over the weekend, proceed to the local emergency room.

## 2018-04-25 NOTE — ED Notes (Signed)
Erroneous entry. Acetaminophen not ordered for this pt.

## 2018-04-25 NOTE — ED Triage Notes (Signed)
Pt c/o diarrhea that started this am at 345. Also c/o nausea, back pain and head and body aches.

## 2018-04-28 ENCOUNTER — Telehealth: Payer: Self-pay | Admitting: Emergency Medicine

## 2018-04-28 NOTE — Telephone Encounter (Signed)
Left message advising if doing well to disregard call, any questions or concerns, contact the office or follow up with PCP. 

## 2018-05-13 DIAGNOSIS — N898 Other specified noninflammatory disorders of vagina: Secondary | ICD-10-CM | POA: Diagnosis not present

## 2018-05-13 DIAGNOSIS — J209 Acute bronchitis, unspecified: Secondary | ICD-10-CM | POA: Diagnosis not present

## 2018-05-13 DIAGNOSIS — L987 Excessive and redundant skin and subcutaneous tissue: Secondary | ICD-10-CM | POA: Diagnosis not present

## 2018-05-14 MED FILL — metroNIDAZOLE 0.75 % GEL: 0.75 | 5 days supply | Qty: 70 | Fill #0

## 2018-05-14 MED FILL — VENTOLIN HFA 90 MCG INHALER: 108 (90 BAS | 17 days supply | Qty: 18 | Fill #0

## 2018-05-14 MED FILL — AZITHROMYCIN 250 MG TABLET: 250 | 5 days supply | Qty: 6 | Fill #0

## 2018-05-14 MED FILL — PROMETHAZINE W/DM SYRUP: 6.25-15 | 6 days supply | Qty: 118 | Fill #0

## 2018-05-14 MED FILL — FLUCONAZOLE 150 MG TABS: 150 | 12 days supply | Qty: 4 | Fill #0

## 2018-05-28 DIAGNOSIS — H5213 Myopia, bilateral: Secondary | ICD-10-CM | POA: Diagnosis not present

## 2018-05-28 DIAGNOSIS — H52203 Unspecified astigmatism, bilateral: Secondary | ICD-10-CM | POA: Diagnosis not present

## 2018-05-28 MED FILL — AMOXICILLIN 250 MG CAPSULE: 250 | 5 days supply | Qty: 15 | Fill #0

## 2018-05-28 MED FILL — HYDROCODON-APAP 5-325: 5-325 | 1 days supply | Qty: 4 | Fill #0

## 2018-05-28 MED FILL — LORAZEPAM 2 MG TABS: 2 | 1 days supply | Qty: 1 | Fill #0

## 2018-06-10 MED FILL — busPIRone HCL 5 MG TABS: 5 | 30 days supply | Qty: 90 | Fill #0

## 2018-06-10 MED FILL — FLUCONAZOLE 150 MG TABS: 150 | 2 days supply | Qty: 2 | Fill #0

## 2018-07-08 ENCOUNTER — Telehealth: Payer: Self-pay | Admitting: Plastic Surgery

## 2018-07-08 NOTE — Telephone Encounter (Signed)
Called patient to confirm appointment scheduled for tomorrow. Patient answered the following questions: °1.Has the patient traveled outside of the state of [place] at all within the past 6 weeks? No °2.Does the patient have a fever or cough at all? No °3.Has the patient been tested for COVID? Had a positive COVID test? No °4. Has the patient been in contact with anyone who has tested positive? No ° °

## 2018-07-09 ENCOUNTER — Other Ambulatory Visit: Payer: Self-pay

## 2018-07-09 ENCOUNTER — Ambulatory Visit: Payer: 59 | Admitting: Plastic Surgery

## 2018-07-09 ENCOUNTER — Encounter: Payer: Self-pay | Admitting: Plastic Surgery

## 2018-07-09 DIAGNOSIS — M793 Panniculitis, unspecified: Secondary | ICD-10-CM | POA: Diagnosis not present

## 2018-07-09 NOTE — Progress Notes (Addendum)
Patient ID: Tracy Brooks, female    DOB: November 20, 1990, 28 y.o.   MRN: [id number]   Chief Complaint  Patient presents with  . Skin Problem    The patient is a 28 year old female here for evaluation of her abdominal excess skin.  She is 5 feet 3 inches tall and weighs 208 pounds now.  She underwent a gastric sleeve procedure in September 2018.  She has lost and has been able to keep her weight steady.  She has been at her current weight for over 6 months.  She has a strong family history of diabetes which was part of what encouraged her to lose the weight.  She is in childcare.  She does not smoke.  She has a mild case of asthma but no other comorbidities.  She complains of recurrent infections in her skin creases particularly worse in the summer with heat.  She tries creams and powders which helps some she is aware that she bruises a skin.  Her only other surgery is a tonsillectomy.  She also has frequent neck and back pain due to having to balance the size of abdominal skin and soft tissue.  She had worked a year ago and all was in normal limits CBC, CMP, llipid profile and TSH. She is requesting a panniculectomy.   Review of Systems  Constitutional: Positive for activity change. Negative for appetite change and fatigue.  HENT: Negative.   Eyes: Negative.   Respiratory: Negative.  Negative for chest tightness and shortness of breath.   Cardiovascular: Negative.  Negative for chest pain and leg swelling.  Gastrointestinal: Negative.  Negative for abdominal distention.  Endocrine: Negative.   Genitourinary: Negative.   Musculoskeletal: Positive for arthralgias, back pain and neck pain.  Neurological: Negative.   Hematological: Negative.   Psychiatric/Behavioral: Negative.     Past Medical History:  Diagnosis Date  . Anxiety    no meds  . Asthma    Child hood seasonal now  . Complication of anesthesia    very upset when wakes up emotional crying until sees dad.  Marland Kitchen Headache    hx of  migraines    Past Surgical History:  Procedure Laterality Date  . LAPAROSCOPIC GASTRIC SLEEVE RESECTION    . LAPAROSCOPIC GASTRIC SLEEVE RESECTION N/A 12/04/2016   Procedure: LAPAROSCOPIC GASTRIC SLEEVE RESECTION WITH UPPER ENDOSCOPY;  Surgeon: Luretha Murphy, MD;  Location: [facility];  Service: General;  Laterality: N/A;  . TONSILLECTOMY        Current Outpatient Medications:  .  albuterol (PROVENTIL HFA;VENTOLIN HFA) 108 (90 Base) MCG/ACT inhaler, Inhale into the lungs., Disp: , Rfl:  .  busPIRone (BUSPAR) 10 MG tablet, Take 10 mg by mouth 3 (three) times daily., Disp: , Rfl:  .  Levonorgestrel (SKYLA) 13.5 MG IUD, by Intrauterine route., Disp: , Rfl:  .  Multiple Vitamin (MULTIVITAMIN WITH MINERALS) TABS tablet, Take 1 tablet by mouth daily., Disp: , Rfl:    Objective:   Vitals:   07/09/18 1526  BP: 121/76  Pulse: 76  Temp: 99.1 F (37.3 C)  SpO2: 94%    Physical Exam Vitals signs and nursing note reviewed.  Constitutional:      Appearance: Normal appearance.  HENT:     Head: Normocephalic and atraumatic.     Nose: Nose normal.     Mouth/Throat:     Mouth: Mucous membranes are moist.  Eyes:     Extraocular Movements: Extraocular movements intact.  Neck:     Musculoskeletal:  Normal range of motion.  Cardiovascular:     Rate and Rhythm: Normal rate.     Pulses: Normal pulses.  Pulmonary:     Effort: Pulmonary effort is normal.  Abdominal:     General: Abdomen is flat. There is no distension.     Palpations: There is no mass.     Tenderness: There is no abdominal tenderness.     Hernia: No hernia is present.  Musculoskeletal: Normal range of motion.        General: No deformity.  Skin:    General: Skin is warm.     Coloration: Skin is not jaundiced.     Findings: No bruising.  Neurological:     General: No focal deficit present.     Mental Status: She is alert and oriented to person, place, and time.  Psychiatric:        Mood and Affect: Mood normal.         Behavior: Behavior normal.        Thought Content: Thought content normal.     Assessment & Plan:  Panniculitis  Recommend a panniculectomy.  Patient has done an amazing job losing the weight.  And she has been able to keep it off as well.  Think she would benefit from a panniculectomy.  We discussed risks and complications, time of surgery, postop recovery and likely challenges more than half of the visit was spent in discussion  Pictures taken with the patient permission and placed in the chart. Alena Billslaire S Dillingham, DO

## 2018-07-10 ENCOUNTER — Encounter: Payer: Self-pay | Admitting: Plastic Surgery

## 2018-07-10 DIAGNOSIS — M793 Panniculitis, unspecified: Secondary | ICD-10-CM | POA: Insufficient documentation

## 2018-07-10 NOTE — Addendum Note (Signed)
Addended by: Peggye Form on: 07/10/2018 07:57 AM   Modules accepted: Level of Service

## 2018-08-15 MED FILL — VALACYCLOVIR HCL 500 MG TAB: 500 | 15 days supply | Qty: 30 | Fill #0

## 2018-08-20 DIAGNOSIS — R1031 Right lower quadrant pain: Secondary | ICD-10-CM | POA: Diagnosis not present

## 2018-08-20 DIAGNOSIS — N202 Calculus of kidney with calculus of ureter: Secondary | ICD-10-CM | POA: Diagnosis not present

## 2018-08-20 DIAGNOSIS — R109 Unspecified abdominal pain: Secondary | ICD-10-CM | POA: Diagnosis not present

## 2018-08-20 DIAGNOSIS — R300 Dysuria: Secondary | ICD-10-CM | POA: Diagnosis not present

## 2018-08-20 MED FILL — TAMSULOSIN HCL 0.4 MG CAP: 0.4 | 7 days supply | Qty: 7 | Fill #0

## 2018-09-13 DIAGNOSIS — T8332XA Displacement of intrauterine contraceptive device, initial encounter: Secondary | ICD-10-CM | POA: Insufficient documentation

## 2018-09-13 DIAGNOSIS — Z30011 Encounter for initial prescription of contraceptive pills: Secondary | ICD-10-CM | POA: Diagnosis not present

## 2018-09-13 DIAGNOSIS — Z30432 Encounter for removal of intrauterine contraceptive device: Secondary | ICD-10-CM | POA: Diagnosis not present

## 2018-09-13 MED FILL — PIMTREA 28 DAY TABLET: 0.15-0.02/0 | 84 days supply | Qty: 84 | Fill #0

## 2018-10-02 DIAGNOSIS — N898 Other specified noninflammatory disorders of vagina: Secondary | ICD-10-CM | POA: Diagnosis not present

## 2018-10-02 DIAGNOSIS — B9689 Other specified bacterial agents as the cause of diseases classified elsewhere: Secondary | ICD-10-CM | POA: Diagnosis not present

## 2018-10-02 DIAGNOSIS — N760 Acute vaginitis: Secondary | ICD-10-CM | POA: Diagnosis not present

## 2018-10-02 MED FILL — metroNIDAZOLE 0.75 % GEL: 0.75 | 7 days supply | Qty: 70 | Fill #0

## 2018-10-07 MED FILL — METRONIDAZOLE 500 MG TABS: 500 | 7 days supply | Qty: 14 | Fill #0

## 2018-10-07 MED FILL — ONDANSETRON HCL 4 MG TABLET: 4 | 7 days supply | Qty: 20 | Fill #0

## 2018-10-09 DIAGNOSIS — R531 Weakness: Secondary | ICD-10-CM | POA: Diagnosis not present

## 2018-10-09 DIAGNOSIS — Z9884 Bariatric surgery status: Secondary | ICD-10-CM | POA: Diagnosis not present

## 2018-10-09 DIAGNOSIS — E162 Hypoglycemia, unspecified: Secondary | ICD-10-CM | POA: Diagnosis not present

## 2018-11-08 DIAGNOSIS — Z1151 Encounter for screening for human papillomavirus (HPV): Secondary | ICD-10-CM | POA: Diagnosis not present

## 2018-11-08 DIAGNOSIS — Z124 Encounter for screening for malignant neoplasm of cervix: Secondary | ICD-10-CM | POA: Diagnosis not present

## 2018-11-08 DIAGNOSIS — Z6837 Body mass index (BMI) 37.0-37.9, adult: Secondary | ICD-10-CM | POA: Diagnosis not present

## 2018-11-08 DIAGNOSIS — Z3041 Encounter for surveillance of contraceptive pills: Secondary | ICD-10-CM | POA: Diagnosis not present

## 2018-11-08 DIAGNOSIS — Z9884 Bariatric surgery status: Secondary | ICD-10-CM | POA: Diagnosis not present

## 2018-11-08 DIAGNOSIS — Z01419 Encounter for gynecological examination (general) (routine) without abnormal findings: Secondary | ICD-10-CM | POA: Diagnosis not present

## 2018-11-08 DIAGNOSIS — Z113 Encounter for screening for infections with a predominantly sexual mode of transmission: Secondary | ICD-10-CM | POA: Diagnosis not present

## 2018-12-13 DIAGNOSIS — F419 Anxiety disorder, unspecified: Secondary | ICD-10-CM | POA: Insufficient documentation

## 2018-12-13 DIAGNOSIS — B001 Herpesviral vesicular dermatitis: Secondary | ICD-10-CM | POA: Insufficient documentation

## 2018-12-25 MED FILL — PIMTREA 28 DAY TABLET: 0.15-0.02/0 | 28 days supply | Qty: 28 | Fill #1

## 2018-12-31 MED FILL — MELOXICAM 15 MG TABLET: 15 | 30 days supply | Qty: 30 | Fill #0

## 2019-01-15 MED FILL — PIMTREA 28 DAY TABLET: 0.15-0.02/0 | 28 days supply | Qty: 28 | Fill #0

## 2019-01-23 MED FILL — PIMTREA 28 DAY TABLET: 0.15-0.02/0 | 28 days supply | Qty: 28 | Fill #0

## 2019-01-29 MED FILL — AZITHROMYCIN 250 MG TABLET: 250 | 5 days supply | Qty: 6 | Fill #0

## 2019-02-03 MED FILL — VENTOLIN HFA 90 MCG INHALER: 108 (90 BAS | 34 days supply | Qty: 18 | Fill #0

## 2019-02-03 MED FILL — ALBUTEROL SUL 2.5 MG/3 ML S: (2.5 MG/3ML | 19 days supply | Qty: 225 | Fill #0

## 2019-02-21 MED FILL — PIMTREA 28 DAY TABLET: 0.15-0.02/0 | 28 days supply | Qty: 28 | Fill #1

## 2019-02-27 MED FILL — miSOPROStol 200 MCG TABS: 200 | 2 days supply | Qty: 4 | Fill #0

## 2019-03-13 MED FILL — NITROFURANTOIN MONO-MCR 100: 100 | 5 days supply | Qty: 10 | Fill #0

## 2019-03-17 MED FILL — PHENAZOPYRIDINE 200 MG TAB: 200 | 3 days supply | Qty: 9 | Fill #0

## 2019-03-25 MED FILL — SULFAMETHOXAZOLE-TMP DS TAB: 800-160 | 5 days supply | Qty: 10 | Fill #0

## 2019-04-11 MED FILL — miSOPROStol 200 MCG TABS: 200 | 2 days supply | Qty: 4 | Fill #0

## 2019-06-05 MED FILL — FLUCONAZOLE 150 MG TABS: 150 | 3 days supply | Qty: 2 | Fill #0

## 2019-06-24 ENCOUNTER — Encounter (HOSPITAL_COMMUNITY): Payer: Self-pay

## 2019-10-10 ENCOUNTER — Emergency Department (INDEPENDENT_AMBULATORY_CARE_PROVIDER_SITE_OTHER)
Admission: EM | Admit: 2019-10-10 | Discharge: 2019-10-10 | Disposition: A | Discharge status: Home / Self Care | Payer: BLUE CROSS/BLUE SHIELD | Source: Home / Self Care

## 2019-10-10 ENCOUNTER — Encounter: Payer: Self-pay | Admitting: Emergency Medicine

## 2019-10-10 ENCOUNTER — Other Ambulatory Visit: Payer: Self-pay

## 2019-10-10 ENCOUNTER — Other Ambulatory Visit (HOSPITAL_COMMUNITY)
Admission: RE | Admit: 2019-10-10 | Discharge: 2019-10-10 | Disposition: A | Discharge status: Home / Self Care | Payer: BLUE CROSS/BLUE SHIELD | Source: Ambulatory Visit | Attending: Family Medicine | Admitting: Family Medicine

## 2019-10-10 DIAGNOSIS — R1031 Right lower quadrant pain: Secondary | ICD-10-CM | POA: Insufficient documentation

## 2019-10-10 DIAGNOSIS — N898 Other specified noninflammatory disorders of vagina: Secondary | ICD-10-CM

## 2019-10-10 DIAGNOSIS — R1032 Left lower quadrant pain: Secondary | ICD-10-CM | POA: Insufficient documentation

## 2019-10-10 DIAGNOSIS — N760 Acute vaginitis: Secondary | ICD-10-CM | POA: Insufficient documentation

## 2019-10-10 DIAGNOSIS — B9689 Other specified bacterial agents as the cause of diseases classified elsewhere: Secondary | ICD-10-CM | POA: Diagnosis not present

## 2019-10-10 DIAGNOSIS — N926 Irregular menstruation, unspecified: Secondary | ICD-10-CM

## 2019-10-10 DIAGNOSIS — R8290 Unspecified abnormal findings in urine: Secondary | ICD-10-CM | POA: Diagnosis not present

## 2019-10-10 LAB — POCT URINALYSIS DIP (MANUAL ENTRY)
Bilirubin, UA: NEGATIVE
Blood, UA: NEGATIVE
Glucose, UA: NEGATIVE mg/dL
Ketones, POC UA: NEGATIVE mg/dL
Nitrite, UA: NEGATIVE
Protein Ur, POC: NEGATIVE mg/dL
Spec Grav, UA: >=1.03 — AB (ref 1.010–1.025)
Urobilinogen, UA: 1 E.U./dL
pH, UA: 7 (ref 5.0–8.0)

## 2019-10-10 LAB — POCT URINE PREGNANCY: Preg Test, Ur: NEGATIVE

## 2019-10-10 NOTE — Discharge Instructions (Signed)
  You will be notified of your test results by phone only if a treatment is indicated.  Negative results can be viewed in your secure online patient portal free [facility] MyChart.  Call to schedule a follow up appointment with you gynecologist next week for further evaluation and treatment of your symptoms if not improving.

## 2019-10-10 NOTE — ED Triage Notes (Signed)
PT reports lower abdominal cramping and cramping over bladder area for 1.5 weeks. Has developed left lower back pain over the last few days.   PT reports discomfort with urinating earlier in the week. White, milky vaginal discharge with an odor, history of frequent BV  Requests STD testing.   1 week late for menstrual.

## 2019-10-10 NOTE — ED Provider Notes (Signed)
[facility]    CSN: [id number] Arrival date & time: 10/10/19  1856      History   Chief Complaint Chief Complaint  Patient presents with  . Abdominal Pain  . Dysuria    HPI Tracy Brooks is a 29 y.o. female.   HPI Tracy Brooks is a 29 y.o. female presenting to UC with c/o lower abdominal cramping over her bladder for about 1.5 weeks.  Associated left lower back pain the last few days. Pain with urinating earlier this week, white milky vaginal discharge with an odor. Hx of BV. She would like to be tested for STIs but no specific exposure.  Her menses is also 1 week late. Pt has an IUD.  Hx of irregular cycle since having the IUD placed 11 months ago.     Past Medical History:  Diagnosis Date  . Anxiety    no meds  . Asthma    Child hood seasonal now  . Complication of anesthesia    very upset when wakes up emotional crying until sees dad.  Marland Kitchen Headache    hx of migraines    Patient Active Problem List   Diagnosis Date Noted  . Panniculitis 07/10/2018  . S/P laparoscopic sleeve gastrectomy Sept 2018 12/04/2016    Past Surgical History:  Procedure Laterality Date  . LAPAROSCOPIC GASTRIC SLEEVE RESECTION    . LAPAROSCOPIC GASTRIC SLEEVE RESECTION N/A 12/04/2016   Procedure: LAPAROSCOPIC GASTRIC SLEEVE RESECTION WITH UPPER ENDOSCOPY;  Surgeon: Luretha Murphy, MD;  Location: [facility];  Service: General;  Laterality: N/A;  . TONSILLECTOMY      OB History   No obstetric history on file.      Home Medications    Prior to Admission medications   Medication Sig Start Date End Date Taking? Authorizing Provider  Levonorgestrel (SKYLA) 13.5 MG IUD by Intrauterine route.   Yes [provider]  albuterol (PROVENTIL HFA;VENTOLIN HFA) 108 (90 Base) MCG/ACT inhaler Inhale into the lungs. 08/05/17   [provider]  busPIRone (BUSPAR) 10 MG tablet Take 10 mg by mouth 3 (three) times daily.    [provider]  Multiple Vitamin (MULTIVITAMIN  WITH MINERALS) TABS tablet Take 1 tablet by mouth daily.    [provider]    Family History Family History  Problem Relation Age of Onset  . Asthma Other   . Cancer Other   . COPD Other   . Hypertension Other   . Heart disease Other   . Diabetes Other   . Diabetes Mother     Social History Social History   Tobacco Use  . Smoking status: Never Smoker  . Smokeless tobacco: Never Used  Vaping Use  . Vaping Use: Never used  Substance Use Topics  . Alcohol use: No  . Drug use: No     Allergies   Tape   Review of Systems Review of Systems  Constitutional: Negative for chills and fever.  Gastrointestinal: Positive for abdominal pain. Negative for diarrhea, nausea and vomiting.  Genitourinary: Positive for dysuria, flank pain, menstrual problem and vaginal discharge. Negative for urgency, vaginal bleeding and vaginal pain.  Musculoskeletal: Positive for back pain.  Neurological: Negative for dizziness and headaches.     Physical Exam Triage Vital Signs ED Triage Vitals  Enc Vitals Group     BP 10/10/19 1921 118/83     Pulse Rate 10/10/19 1921 80     Resp 10/10/19 1921 16     Temp 10/10/19 1921 99.4 F (  37.4 C)     Temp Source 10/10/19 1921 Oral     SpO2 10/10/19 1921 100 %     Weight --      Height --      Head Circumference --      Peak Flow --      Pain Score 10/10/19 1919 1     Pain Loc --      Pain Edu? --      Excl. in GC? --    No data found.  Updated Vital Signs BP 118/83   Pulse 80   Temp 99.4 F (37.4 C) (Oral)   Resp 16   LMP 09/05/2019   SpO2 100%      Physical Exam Vitals and nursing note reviewed. Exam conducted with a chaperone present.  Constitutional:      General: She is not in acute distress.    Appearance: She is well-developed. She is not ill-appearing, toxic-appearing or diaphoretic.  HENT:     Head: Normocephalic and atraumatic.     Right Ear: Tympanic membrane and ear canal normal.     Left Ear: Tympanic  membrane and ear canal normal.  Cardiovascular:     Rate and Rhythm: Normal rate and regular rhythm.  Pulmonary:     Effort: Pulmonary effort is normal. No respiratory distress.     Breath sounds: Normal breath sounds.  Abdominal:     General: There is no distension.     Palpations: Abdomen is soft.     Tenderness: There is abdominal tenderness in the suprapubic area. There is no right CVA tenderness or left CVA tenderness.  Genitourinary:    Vagina: Vaginal discharge present. No bleeding.     Cervix: Normal.     Uterus: Normal.      Adnexa: Right adnexa normal and left adnexa normal.  Musculoskeletal:        General: Normal range of motion.     Cervical back: Normal range of motion.  Skin:    General: Skin is warm and dry.  Neurological:     Mental Status: She is alert and oriented to person, place, and time.  Psychiatric:        Behavior: Behavior normal.      UC Treatments / Results  Labs (all labs ordered are listed, but only abnormal results are displayed) Labs Reviewed  POCT URINALYSIS DIP (MANUAL ENTRY) - Abnormal; Notable for the following components:      Result Value   Clarity, UA cloudy (*)    Spec Grav, UA >=1.030 (*)    Leukocytes, UA Trace (*)    All other components within normal limits  URINE CULTURE  POCT URINE PREGNANCY  CERVICOVAGINAL ANCILLARY ONLY    EKG   Radiology No results found.  Procedures Procedures (including critical care time)  Medications Ordered in UC Medications - No data to display  Initial Impression / Assessment and Plan / UC Course  I have reviewed the triage vital signs and the nursing notes.  Pertinent labs & imaging results that were available during my care of the patient were reviewed by me and considered in my medical decision making (see chart for details).     UA no definite UTI Culture sent Vaginal swab pending Urine preg: negative F/u PCP and/or gynecology  AVS provided  Final Clinical Impressions(s)  / UC Diagnoses   Final diagnoses:  Abnormal urinalysis  Bilateral lower abdominal cramping  Vaginal discharge  Late menses     Discharge Instructions  You will be notified of your test results by phone only if a treatment is indicated.  Negative results can be viewed in your secure online patient portal free [facility] MyChart.  Call to schedule a follow up appointment with you gynecologist next week for further evaluation and treatment of your symptoms if not improving.     ED Prescriptions    None     PDMP not reviewed this encounter.   Lurene Shadow, [place] 10/12/19 [phone]

## 2019-10-12 LAB — URINE CULTURE
MICRO NUMBER:: 10745074
Result:: NO GROWTH
SPECIMEN QUALITY:: ADEQUATE

## 2019-10-14 ENCOUNTER — Telehealth (HOSPITAL_COMMUNITY): Payer: Self-pay | Admitting: Emergency Medicine

## 2019-10-14 LAB — CERVICOVAGINAL ANCILLARY ONLY
Bacterial Vaginitis (gardnerella): POSITIVE — AB
Candida Glabrata: NEGATIVE
Candida Vaginitis: NEGATIVE
Chlamydia: NEGATIVE
Comment: NEGATIVE
Comment: NEGATIVE
Comment: NEGATIVE
Comment: NEGATIVE
Comment: NEGATIVE
Comment: NORMAL
Neisseria Gonorrhea: NEGATIVE
Trichomonas: NEGATIVE

## 2019-10-14 MED ORDER — METRONIDAZOLE 500 MG PO TABS: 500.0000 mg | ORAL_TABLET | Freq: Two times a day (BID) | ORAL | 0 refills | Status: AC

## 2019-11-30 ENCOUNTER — Emergency Department (INDEPENDENT_AMBULATORY_CARE_PROVIDER_SITE_OTHER)
Admission: EM | Admit: 2019-11-30 | Discharge: 2019-11-30 | Disposition: A | Discharge status: Home / Self Care | Payer: BC Managed Care – PPO | Source: Home / Self Care

## 2019-11-30 ENCOUNTER — Other Ambulatory Visit: Payer: Self-pay

## 2019-11-30 DIAGNOSIS — M436 Torticollis: Secondary | ICD-10-CM

## 2019-11-30 MED ORDER — PREDNISOLONE SODIUM PHOSPHATE 5 MG/5ML PO SOLN: 15.0000 mg | Freq: Two times a day (BID) | ORAL | 0 refills | Status: AC

## 2019-11-30 MED ORDER — HYDROCODONE-ACETAMINOPHEN 5-325 MG PO TABS: 1.0000 | ORAL_TABLET | Freq: Four times a day (QID) | ORAL | 0 refills | Status: DC | PRN

## 2019-11-30 NOTE — ED Triage Notes (Signed)
Pt states that she has some neck pain that radiates to her shoulders. The pain comes and goes x1 day. Pt  States that she is not vaccinated. Pt states that she did ride some roller coaster rides yesterday.

## 2019-11-30 NOTE — Discharge Instructions (Addendum)
You should be much better in the morning.  Take the liquid cortisone product twice a day starting with 2 doses today.

## 2019-11-30 NOTE — ED Provider Notes (Signed)
[facility]    CSN: [id number] Arrival date & time: 11/30/19  1545      History   Chief Complaint No chief complaint on file.   HPI Tracy Brooks is a 29 y.o. female.   This is an established [facility] urgent care patient who presents with neck soreness for the last 24 hours.  She rode roller coasters yesterday and gradually developed posterior neck pain within hours of the rides.  Pain is worse today.  She had no trauma to the neck, has no motor or sensory loss in the extremities, and has not run a fever.  Patient works in a [employer].     Past Medical History:  Diagnosis Date  . Anxiety    no meds  . Asthma    Child hood seasonal now  . Complication of anesthesia    very upset when wakes up emotional crying until sees dad.  Marland Kitchen Headache    hx of migraines    Patient Active Problem List   Diagnosis Date Noted  . Panniculitis 07/10/2018  . S/P laparoscopic sleeve gastrectomy Sept 2018 12/04/2016    Past Surgical History:  Procedure Laterality Date  . LAPAROSCOPIC GASTRIC SLEEVE RESECTION    . LAPAROSCOPIC GASTRIC SLEEVE RESECTION N/A 12/04/2016   Procedure: LAPAROSCOPIC GASTRIC SLEEVE RESECTION WITH UPPER ENDOSCOPY;  Surgeon: Luretha Murphy, MD;  Location: [facility];  Service: General;  Laterality: N/A;  . TONSILLECTOMY      OB History   No obstetric history on file.      Home Medications    Prior to Admission medications   Medication Sig Start Date End Date Taking? Authorizing Provider  albuterol (PROVENTIL HFA;VENTOLIN HFA) 108 (90 Base) MCG/ACT inhaler Inhale into the lungs. 08/05/17  Yes [provider]  busPIRone (BUSPAR) 10 MG tablet Take 10 mg by mouth 3 (three) times daily.   Yes [provider]  Levonorgestrel (SKYLA) 13.5 MG IUD by Intrauterine route.   Yes [provider]  metroNIDAZOLE (FLAGYL) 500 MG tablet Take 1 tablet (500 mg total) by mouth 2 (two) times daily. 10/14/19  Yes Lamptey, Britta Mccreedy, MD    Multiple Vitamin (MULTIVITAMIN WITH MINERALS) TABS tablet Take 1 tablet by mouth daily.   Yes [provider]  HYDROcodone-acetaminophen (NORCO) 5-325 MG tablet Take 1 tablet by mouth every 6 (six) hours as needed for moderate pain. 11/30/19   Elvina Sidle, MD  prednisoLONE sodium phosphate (PEDIAPRED) 6.7 (5 Base) MG/5ML SOLN Take 15 mLs (15 mg total) by mouth 2 (two) times daily after a meal for 2 days. 11/30/19 12/02/19  Elvina Sidle, MD    Family History Family History  Problem Relation Age of Onset  . Asthma Other   . Cancer Other   . COPD Other   . Hypertension Other   . Heart disease Other   . Diabetes Other   . Diabetes Mother     Social History Social History   Tobacco Use  . Smoking status: Never Smoker  . Smokeless tobacco: Never Used  Vaping Use  . Vaping Use: Never used  Substance Use Topics  . Alcohol use: No  . Drug use: No     Allergies   Nsaids and Tape   Review of Systems Review of Systems  Musculoskeletal: Positive for neck pain.  All other systems reviewed and are negative.    Physical Exam Triage Vital Signs ED Triage Vitals  Enc Vitals Group     BP 11/30/19 1621 112/74  Pulse Rate 11/30/19 1621 75     Resp --      Temp 11/30/19 1621 98.3 F (36.8 C)     Temp Source 11/30/19 1621 Oral     SpO2 11/30/19 1621 100 %     Weight 11/30/19 1617 189 lb (85.7 kg)     Height 11/30/19 1617 5\' 2"  (1.575 m)     Head Circumference --      Peak Flow --      Pain Score 11/30/19 1616 6     Pain Loc --      Pain Edu? --      Excl. in GC? --    No data found.  Updated Vital Signs BP 112/74 (BP Location: Right Arm)   Pulse 75   Temp 98.3 F (36.8 C) (Oral)   Ht 5\' 2"  (1.575 m)   Wt 85.7 kg   LMP 11/23/2019   SpO2 100%   BMI 34.57 kg/m    Physical Exam Vitals and nursing note reviewed.  Constitutional:      Appearance: Normal appearance. She is normal weight.  Eyes:     Conjunctiva/sclera: Conjunctivae normal.   Neck:     Comments: Mild posterior neck muscle tenderness with some trapezius tenderness Pulmonary:     Effort: Pulmonary effort is normal.  Musculoskeletal:        General: Normal range of motion.     Cervical back: Normal range of motion and neck supple. Tenderness present.  Skin:    General: Skin is warm and dry.  Neurological:     General: No focal deficit present.     Mental Status: She is alert and oriented to person, place, and time.  Psychiatric:        Mood and Affect: Mood normal.        Behavior: Behavior normal.        Thought Content: Thought content normal.        Judgment: Judgment normal.      UC Treatments / Results  Labs (all labs ordered are listed, but only abnormal results are displayed) Labs Reviewed - No data to display  EKG   Radiology No results found.  Procedures Procedures (including critical care time)  Medications Ordered in UC Medications - No data to display  Initial Impression / Assessment and Plan / UC Course  I have reviewed the triage vital signs and the nursing notes.  Pertinent labs & imaging results that were available during my care of the patient were reviewed by me and considered in my medical decision making (see chart for details).     Final Clinical Impressions(s) / UC Diagnoses   Final diagnoses:  Torticollis, acute     Discharge Instructions     You should be much better in the morning.  Take the liquid cortisone product twice a day starting with 2 doses today.    ED Prescriptions    Medication Sig Dispense Auth. Provider   prednisoLONE sodium phosphate (PEDIAPRED) 6.7 (5 Base) MG/5ML SOLN Take 15 mLs (15 mg total) by mouth 2 (two) times daily after a meal for 2 days. 75 mL , MD   HYDROcodone-acetaminophen (NORCO) 5-325 MG tablet Take 1 tablet by mouth every 6 (six) hours as needed for moderate pain. 12 tablet 01/23/2020, MD     I have reviewed the PDMP during this encounter.    Elvina Sidle, MD 11/30/19 [phone]

## 2019-12-10 DIAGNOSIS — G4452 New daily persistent headache (NDPH): Secondary | ICD-10-CM | POA: Insufficient documentation

## 2019-12-10 DIAGNOSIS — M62838 Other muscle spasm: Secondary | ICD-10-CM | POA: Insufficient documentation

## 2020-01-12 DIAGNOSIS — G43009 Migraine without aura, not intractable, without status migrainosus: Secondary | ICD-10-CM | POA: Insufficient documentation

## 2020-01-17 ENCOUNTER — Encounter: Payer: Self-pay | Admitting: Emergency Medicine

## 2020-01-17 ENCOUNTER — Emergency Department: Payer: BC Managed Care – PPO

## 2020-01-17 ENCOUNTER — Emergency Department (INDEPENDENT_AMBULATORY_CARE_PROVIDER_SITE_OTHER)
Admission: EM | Admit: 2020-01-17 | Discharge: 2020-01-17 | Disposition: A | Discharge status: Home / Self Care | Payer: BC Managed Care – PPO | Source: Home / Self Care

## 2020-01-17 ENCOUNTER — Other Ambulatory Visit: Payer: Self-pay

## 2020-01-17 ENCOUNTER — Emergency Department (INDEPENDENT_AMBULATORY_CARE_PROVIDER_SITE_OTHER): Payer: BC Managed Care – PPO

## 2020-01-17 ENCOUNTER — Ambulatory Visit: Payer: BC Managed Care – PPO

## 2020-01-17 DIAGNOSIS — N132 Hydronephrosis with renal and ureteral calculous obstruction: Secondary | ICD-10-CM | POA: Diagnosis not present

## 2020-01-17 DIAGNOSIS — N3001 Acute cystitis with hematuria: Secondary | ICD-10-CM | POA: Diagnosis not present

## 2020-01-17 DIAGNOSIS — R1900 Intra-abdominal and pelvic swelling, mass and lump, unspecified site: Secondary | ICD-10-CM

## 2020-01-17 LAB — POCT URINALYSIS DIP (MANUAL ENTRY)
Bilirubin, UA: NEGATIVE
Glucose, UA: NEGATIVE mg/dL
Leukocytes, UA: NEGATIVE
Nitrite, UA: NEGATIVE
Spec Grav, UA: 1.025 (ref 1.010–1.025)
Urobilinogen, UA: 1 E.U./dL
pH, UA: 7.5 (ref 5.0–8.0)

## 2020-01-17 LAB — POCT URINE PREGNANCY: Preg Test, Ur: NEGATIVE

## 2020-01-17 MED ORDER — HYDROCODONE-ACETAMINOPHEN 5-325 MG PO TABS: 1.0000 | ORAL_TABLET | Freq: Four times a day (QID) | ORAL | 0 refills | Status: AC | PRN

## 2020-01-17 MED ORDER — ONDANSETRON 4 MG PO TBDP
4.0000 mg | ORAL_TABLET | Freq: Once | ORAL | Status: AC
  Administered 2020-01-17: 4 mg via ORAL

## 2020-01-17 MED ORDER — TAMSULOSIN HCL 0.4 MG PO CAPS: 0.4000 mg | ORAL_CAPSULE | Freq: Every day | ORAL | 0 refills | Status: AC

## 2020-01-17 NOTE — Discharge Instructions (Signed)
  Norco/Vicodin (hydrocodone-acetaminophen) is a narcotic pain medication, do not combine these medications with others containing tylenol. While taking, do not drink alcohol, drive, or perform any other activities that requires focus while taking these medications.   Please be sure to stay well hydrated and take the tamsulosin as prescribed to help with passing the stone. Call to schedule a follow up appointment with your gynecologist this week for further evaluation and treatment of the pelvic mass. A pelvic ultrasound is recommended.  You should also establish care with a urologist due to recurring kidney stones.    Call 911 or have someone drive you to the hospital if symptoms significantly worsening including worsening pain, fever, vomiting, unable to urinate or other new concerning symptoms develop.

## 2020-01-17 NOTE — ED Provider Notes (Signed)
[facility]    CSN: [id number] Arrival date & time: 01/17/20  1229      History   Chief Complaint Chief Complaint  Patient presents with  . Abdominal Pain    right    HPI Tracy Brooks is a [age] y.o. female.   HPI Tracy Brooks is a 29 y.o. female presenting to UC with c/o Right flank pain radiating into Right lower pelvis since yesterday. Associated burning with urination and decreased output.  Hx of kidney stone, 1mm per medical records, about 1 year ago.  Symptoms feel similar.     Past Medical History:  Diagnosis Date  . Anxiety    no meds  . Asthma    Child hood seasonal now  . Complication of anesthesia    very upset when wakes up emotional crying until sees dad.  Marland Kitchen. Headache    hx of migraines    Patient Active Problem List   Diagnosis Date Noted  . Migraine without aura and without status migrainosus, not intractable 01/12/2020  . New daily persistent headache 12/10/2019  . Neck muscle spasm 12/10/2019  . Anxiety 12/13/2018  . Recurrent cold sores 12/13/2018  . IUD migration 09/13/2018  . Panniculitis 07/10/2018  . S/P laparoscopic sleeve gastrectomy Sept 2018 12/04/2016  . Elevated glucose level 09/15/2015  . GERD without esophagitis 05/12/2015    Past Surgical History:  Procedure Laterality Date  . LAPAROSCOPIC GASTRIC SLEEVE RESECTION    . LAPAROSCOPIC GASTRIC SLEEVE RESECTION N/A 12/04/2016   Procedure: LAPAROSCOPIC GASTRIC SLEEVE RESECTION WITH UPPER ENDOSCOPY;  Surgeon: Luretha MurphyMartin, Matthew, MD;  Location: [facility];  Service: General;  Laterality: N/A;  . TONSILLECTOMY      OB History   No obstetric history on file.      Home Medications    Prior to Admission medications   Medication Sig Start Date End Date Taking? Authorizing Provider  albuterol (PROVENTIL HFA;VENTOLIN HFA) 108 (90 Base) MCG/ACT inhaler Inhale into the lungs. 08/05/17   [provider]  busPIRone (BUSPAR) 10 MG tablet Take 10 mg by mouth 3 (three) times  daily.    [provider]  HYDROcodone-acetaminophen (NORCO/VICODIN) 5-325 MG tablet Take 1-2 tablets by mouth every 6 (six) hours as needed. 01/17/20   Lurene ShadowPhelps, Donovan Persley O, PA-C  Levonorgestrel (SKYLA) 13.5 MG IUD by Intrauterine route.    [provider]  metroNIDAZOLE (FLAGYL) 500 MG tablet Take 1 tablet (500 mg total) by mouth 2 (two) times daily. 10/14/19   LampteyBritta Mccreedy, Philip O, MD  Multiple Vitamin (MULTIVITAMIN WITH MINERALS) TABS tablet Take 1 tablet by mouth daily.    [provider]  tamsulosin (FLOMAX) 0.4 MG CAPS capsule Take 1 capsule (0.4 mg total) by mouth daily. 01/17/20   Lurene ShadowPhelps, Giovanie Lefebre O, PA-C    Family History Family History  Problem Relation Age of Onset  . Asthma Other   . Cancer Other   . COPD Other   . Hypertension Other   . Heart disease Other   . Diabetes Other   . Diabetes Mother     Social History Social History   Tobacco Use  . Smoking status: Never Smoker  . Smokeless tobacco: Never Used  Vaping Use  . Vaping Use: Never used  Substance Use Topics  . Alcohol use: No  . Drug use: No     Allergies   Nsaids and Tape   Review of Systems Review of Systems  Constitutional: Negative for chills and fever.  Gastrointestinal: Positive for abdominal pain and  nausea. Negative for diarrhea and vomiting.  Genitourinary: Positive for decreased urine volume, dysuria, flank pain, frequency, hematuria and urgency. Negative for vaginal bleeding, vaginal discharge and vaginal pain.  Musculoskeletal: Positive for back pain. Negative for myalgias.  Neurological: Negative for dizziness and headaches.     Physical Exam Triage Vital Signs ED Triage Vitals  Enc Vitals Group     BP 01/17/20 1331 (!) 149/103     Pulse Rate 01/17/20 1331 80     Resp 01/17/20 1331 (!) 22     Temp 01/17/20 1331 98 F (36.7 C)     Temp Source 01/17/20 1331 Oral     SpO2 01/17/20 1331 100 %     Weight 01/17/20 1333 188 lb 15 oz (85.7 kg)     Height 01/17/20 1333  5\' 2"  (1.575 m)     Head Circumference --      Peak Flow --      Pain Score 01/17/20 1332 10     Pain Loc --      Pain Edu? --      Excl. in GC? --    No data found.  Updated Vital Signs BP (!) 149/103 (BP Location: Right Arm) Comment: 10/10 pain  Pulse 80   Temp 98 F (36.7 C) (Oral)   Resp (!) 22   Ht 5\' 2"  (1.575 m)   Wt 188 lb 15 oz (85.7 kg)   SpO2 100%   BMI 34.56 kg/m   Visual Acuity Right Eye Distance:   Left Eye Distance:   Bilateral Distance:    Right Eye Near:   Left Eye Near:    Bilateral Near:     Physical Exam Vitals and nursing note reviewed.  Constitutional:      General: She is not in acute distress.    Appearance: She is well-developed. She is not ill-appearing, toxic-appearing or diaphoretic.  HENT:     Head: Normocephalic and atraumatic.  Cardiovascular:     Rate and Rhythm: Normal rate and regular rhythm.  Pulmonary:     Effort: Pulmonary effort is normal. No respiratory distress.     Breath sounds: Normal breath sounds.  Abdominal:     General: There is no distension.     Palpations: Abdomen is soft.     Tenderness: There is abdominal tenderness in the right lower quadrant and suprapubic area. There is right CVA tenderness. There is no left CVA tenderness, guarding or rebound. Negative signs include Murphy's sign and McBurney's sign.  Musculoskeletal:        General: Normal range of motion.     Cervical back: Normal range of motion.  Skin:    General: Skin is warm and dry.  Neurological:     Mental Status: She is alert and oriented to person, place, and time.  Psychiatric:        Behavior: Behavior normal.      UC Treatments / Results  Labs (all labs ordered are listed, but only abnormal results are displayed) Labs Reviewed  POCT URINALYSIS DIP (MANUAL ENTRY) - Abnormal; Notable for the following components:      Result Value   Clarity, UA cloudy (*)    Ketones, POC UA trace (5) (*)    Blood, UA large (*)    Protein Ur, POC  trace (*)    All other components within normal limits  URINE CULTURE  POCT URINE PREGNANCY    EKG   Radiology CT RENAL STONE STUDY  Addendum Date: 01/17/2020  ADDENDUM REPORT: 01/17/2020 15:28 ADDENDUM: These results were called by telephone at the time of interpretation on 01/17/2020 at 3:28 pm to provider [facility] , who verbally acknowledged these results. Electronically Signed   By: Richarda Overlie M.D.   On: 01/17/2020 15:28   Result Date: 01/17/2020 CLINICAL DATA:  Right flank pain.  Hematuria. EXAM: CT ABDOMEN AND PELVIS WITHOUT CONTRAST TECHNIQUE: Multidetector CT imaging of the abdomen and pelvis was performed following the standard protocol without IV contrast. COMPARISON:  CT abdomen and pelvis 08/20/2018 FINDINGS: Lower chest: Lung bases are clear. Hepatobiliary: Normal appearance of the liver and gallbladder. Pancreas: Unremarkable. No pancreatic ductal dilatation or surrounding inflammatory changes. Spleen: Normal in size without focal abnormality. Adrenals/Urinary Tract: Normal appearance of left adrenal gland. Right adrenal gland is poorly characterized. Normal appearance of left kidney without stones or hydronephrosis. Right perinephric stranding with mild right hydronephrosis. Right ureter is dilated. There is a calcification in the right hemipelvis near the right ureterovesical junction. This calcification measures 3 mm and suggestive for an obstructing stone. The urinary bladder is completely decompressed and compressed from a low-density structure in the anterior pelvis. This anterior pelvic structure was not present on the prior examination. No other kidney or ureter stones. Stomach/Bowel: Evidence for normal appendix. No evidence for acute bowel inflammation or bowel obstruction. History of gastric sleeve procedure. Vascular/Lymphatic: Vascular structures are unremarkable. No significant abdominopelvic lymphadenopathy. Reproductive: There is an IUD in place. There is a new  slightly dense structure anterior to the uterus and compressing the urinary bladder. This structure measures 6.0 x 6.1 x 7.5 cm. This pelvic lesion was not present on the previous examination. Hounsfield units are not compatible with a simple cyst but suspect this structure is related to the left ovary or left adnexa. Other: Trace free fluid in the pelvis.  Negative for free air. Musculoskeletal: No acute bone abnormality. IMPRESSION: 1. Right hydroureteronephrosis with right perinephric edema. Findings are secondary to an obstructing 3 mm stone near the right ureterovesical junction. 2. New 7.5 cm lesion in the pelvis that is compressing the urinary bladder. This may represent a left ovarian or adnexal lesion. Recommend further characterization with pelvic ultrasound. 3. Trace free fluid in pelvis may be physiologic. Electronically Signed: By: Richarda Overlie M.D. On: 01/17/2020 15:23    Procedures Procedures (including critical care time)  Medications Ordered in UC Medications  ondansetron (ZOFRAN-ODT) disintegrating tablet 4 mg (4 mg Oral Given 01/17/20 1333)    Initial Impression / Assessment and Plan / UC Course  I have reviewed the triage vital signs and the nursing notes.  Pertinent labs & imaging results that were available during my care of the patient were reviewed by me and considered in my medical decision making (see chart for details).     Discussed imaging with pt  Will tx for 44mm stone with flomax and pain medication Encouraged f/u with a urologist Also discussed 7.5cm lesion noted on CT Pt reports family hx of PCOS, she has not been officially diagnosed but has had cysts in the past. Encouraged to schedule f/u with her GYN for a pelvic ultrasound this week for further evaluation and treatment of lesion noted on CT Discussed symptoms that warrant emergent care in the ED. Pt verbalized understanding and agreement with tx plan Pt feels comfortable being discharged home.  AVS  given  Final Clinical Impressions(s) / UC Diagnoses   Final diagnoses:  Acute cystitis with hematuria  Pelvic mass in female  Ureteral stone with  hydronephrosis     Discharge Instructions      Norco/Vicodin (hydrocodone-acetaminophen) is a narcotic pain medication, do not combine these medications with others containing tylenol. While taking, do not drink alcohol, drive, or perform any other activities that requires focus while taking these medications.   Please be sure to stay well hydrated and take the tamsulosin as prescribed to help with passing the stone. Call to schedule a follow up appointment with your gynecologist this week for further evaluation and treatment of the pelvic mass. A pelvic ultrasound is recommended.  You should also establish care with a urologist due to recurring kidney stones.    Call 911 or have someone drive you to the hospital if symptoms significantly worsening including worsening pain, fever, vomiting, unable to urinate or other new concerning symptoms develop.      ED Prescriptions    Medication Sig Dispense Auth. Provider   HYDROcodone-acetaminophen (NORCO/VICODIN) 5-325 MG tablet Take 1-2 tablets by mouth every 6 (six) hours as needed. 12 tablet Waylan Rocher O, PA-C   tamsulosin (FLOMAX) 0.4 MG CAPS capsule Take 1 capsule (0.4 mg total) by mouth daily. 30 capsule Lurene Shadow, [place]     I have reviewed the PDMP during this encounter.   Lurene Shadow, [place] 01/18/20 [phone]

## 2020-01-17 NOTE — ED Triage Notes (Signed)
Pt in bathroom at start of triage - had to void on arrival - returned to bathroom to attempt to pass what pt thought was a kidney stone on the right C/o burning with urination Unable to start triage until pt returned to room Pt denies surgery in past for kidney stone  History of kidney stones - last one was a year ago R lower abdominal pain w/ R back pain  No COVID vaccine

## 2020-01-18 LAB — URINE CULTURE
MICRO NUMBER:: 11141353
Result:: NO GROWTH
SPECIMEN QUALITY:: ADEQUATE

## 2020-01-19 MED FILL — ONDANSETRON ODT 8 MG TABLET: 8 | 38 days supply | Qty: 20 | Fill #0

## 2020-06-15 ENCOUNTER — Encounter (HOSPITAL_COMMUNITY): Payer: Self-pay | Admitting: *Deleted

## 2021-06-23 ENCOUNTER — Encounter (HOSPITAL_COMMUNITY): Payer: Self-pay | Admitting: *Deleted

## 2022-07-06 ENCOUNTER — Encounter (HOSPITAL_COMMUNITY): Payer: Self-pay | Admitting: *Deleted

## 2023-07-04 ENCOUNTER — Encounter (HOSPITAL_COMMUNITY): Payer: Self-pay | Admitting: *Deleted
